# Patient Record
Sex: Male | Born: 1975 | Race: White | Hispanic: No | Smoking: Former smoker
Health system: Southern US, Community
[De-identification: ages and names within clinical notes are randomized; demographics above are authoritative.]

## PROBLEM LIST (undated history)

## (undated) DIAGNOSIS — J40 Bronchitis, not specified as acute or chronic: Secondary | ICD-10-CM

## (undated) DIAGNOSIS — J302 Other seasonal allergic rhinitis: Secondary | ICD-10-CM

## (undated) DIAGNOSIS — J45909 Unspecified asthma, uncomplicated: Secondary | ICD-10-CM

## (undated) DIAGNOSIS — F32A Depression, unspecified: Secondary | ICD-10-CM

## (undated) DIAGNOSIS — S8990XA Unspecified injury of unspecified lower leg, initial encounter: Secondary | ICD-10-CM

## (undated) HISTORY — PX: WISDOM TOOTH EXTRACTION: SHX21

## (undated) HISTORY — PX: KNEE ARTHROSCOPY: SHX127

## (undated) HISTORY — DX: Unspecified asthma, uncomplicated: J45.909

## (undated) HISTORY — DX: Bronchitis, not specified as acute or chronic: J40

## (undated) HISTORY — DX: Unspecified injury of unspecified lower leg, initial encounter: S89.90XA

## (undated) HISTORY — DX: Other seasonal allergic rhinitis: J30.2

## (undated) HISTORY — DX: Depression, unspecified: F32.A

---

## 1999-01-20 ENCOUNTER — Inpatient Hospital Stay (HOSPITAL_COMMUNITY): Admission: EM | Admit: 1999-01-20 | Discharge: 1999-01-21 | Payer: Self-pay | Admitting: Emergency Medicine

## 1999-01-20 ENCOUNTER — Encounter: Payer: Self-pay | Admitting: Surgery

## 1999-03-27 HISTORY — PX: KNEE ARTHROSCOPY: SHX127

## 2004-08-01 ENCOUNTER — Ambulatory Visit: Payer: Self-pay | Admitting: Family Medicine

## 2004-08-24 ENCOUNTER — Ambulatory Visit: Payer: Self-pay | Admitting: Family Medicine

## 2005-04-03 ENCOUNTER — Ambulatory Visit: Payer: Self-pay | Admitting: Family Medicine

## 2005-05-31 ENCOUNTER — Emergency Department (HOSPITAL_COMMUNITY): Admission: EM | Admit: 2005-05-31 | Discharge: 2005-05-31 | Payer: Self-pay | Admitting: Emergency Medicine

## 2006-02-19 ENCOUNTER — Ambulatory Visit: Payer: Self-pay | Admitting: Family Medicine

## 2006-05-31 ENCOUNTER — Ambulatory Visit: Payer: Self-pay | Admitting: Family Medicine

## 2013-09-02 ENCOUNTER — Ambulatory Visit (INDEPENDENT_AMBULATORY_CARE_PROVIDER_SITE_OTHER): Payer: No Typology Code available for payment source | Admitting: Family Medicine

## 2013-09-02 VITALS — BP 116/74 | HR 60 | Temp 98.6°F | Wt 214.2 lb

## 2013-09-02 DIAGNOSIS — L255 Unspecified contact dermatitis due to plants, except food: Secondary | ICD-10-CM

## 2013-09-02 MED ORDER — METHYLPREDNISOLONE (PAK) 4 MG PO TABS: ORAL_TABLET | ORAL | Status: DC

## 2013-09-02 MED ORDER — METHYLPREDNISOLONE ACETATE 80 MG/ML IJ SUSP
80.0000 mg | Freq: Once | INTRAMUSCULAR | Status: AC
  Administered 2013-09-02: 80 mg via INTRAMUSCULAR

## 2013-09-02 NOTE — Progress Notes (Signed)
   Subjective:    Patient ID: James Hull, male    DOB: Mar 04, 1976, 38 y.o.   MRN: [id number]  HPI This 38 y.o. male presents for evaluation of poison oak dermatitis and rash on face and bilateral arms right worse than left.   Review of Systems C/o rash and pruritis No chest pain, SOB, HA, dizziness, vision change, N/V, diarrhea, constipation, dysuria, urinary urgency or frequency, myalgias, arthralgias.     Objective:   Physical Exam Vital signs noted  Well developed well nourished male.  HEENT - Head atraumatic Normocephalic Respiratory - Lungs CTA bilateral Cardiac - RRR S1 and S2 without murmur Skin - Erythematous raised rash scattered across arms       Assessment & Plan:  Dermatitis due to plants, including poison ivy, sumac, and oak - Plan: methylPREDNIsolone (MEDROL DOSPACK) 4 MG tablet, methylPREDNISolone acetate (DEPO-MEDROL) injection 80 mg Take benadryl otc for pruritis.  Lysbeth Penner FNP

## 2014-02-09 ENCOUNTER — Encounter: Payer: Self-pay | Admitting: Family Medicine

## 2014-02-09 ENCOUNTER — Ambulatory Visit (INDEPENDENT_AMBULATORY_CARE_PROVIDER_SITE_OTHER): Payer: BC Managed Care – PPO | Admitting: Family Medicine

## 2014-02-09 VITALS — BP 107/71 | HR 83 | Temp 99.0°F | Ht 72.0 in | Wt 222.0 lb

## 2014-02-09 DIAGNOSIS — J029 Acute pharyngitis, unspecified: Secondary | ICD-10-CM

## 2014-02-09 LAB — POCT RAPID STREP A (OFFICE): Rapid Strep A Screen: NEGATIVE

## 2014-02-09 MED ORDER — AZITHROMYCIN 250 MG PO TABS: ORAL_TABLET | ORAL | Status: DC

## 2014-02-09 NOTE — Addendum Note (Signed)
Addended by: Lysbeth Penner on: 02/09/2014 03:34 PM   Modules accepted: Orders, SmartSet

## 2014-02-09 NOTE — Progress Notes (Signed)
   Subjective:    Patient ID: James Hull, male    DOB: 03/09/1976, 38 y.o.   MRN: [id number]  HPI Patient c/o sore throat and uri sx's.  Review of Systems  Constitutional: Negative for fever.  HENT: Negative for ear pain.   Eyes: Negative for discharge.  Respiratory: Negative for cough.   Cardiovascular: Negative for chest pain.  Gastrointestinal: Negative for abdominal distention.  Endocrine: Negative for polyuria.  Genitourinary: Negative for difficulty urinating.  Musculoskeletal: Negative for gait problem and neck pain.  Skin: Negative for color change and rash.  Neurological: Negative for speech difficulty and headaches.  Psychiatric/Behavioral: Negative for agitation.       Objective:    BP 107/71 mmHg  Pulse 83  Temp(Src) 99 F (37.2 C) (Oral)  Ht 6' (1.829 m)  Wt 222 lb (100.699 kg)  BMI 30.10 kg/m2 Physical Exam  Constitutional: He is oriented to person, place, and time. He appears well-developed and well-nourished.  HENT:  Head: Normocephalic and atraumatic.  Mouth/Throat: Oropharynx is clear and moist.  Eyes: Pupils are equal, round, and reactive to light.  Neck: Normal range of motion. Neck supple.  Cardiovascular: Normal rate and regular rhythm.   No murmur heard. Pulmonary/Chest: Effort normal and breath sounds normal.  Abdominal: Soft. Bowel sounds are normal. There is no tenderness.  Neurological: He is alert and oriented to person, place, and time.  Skin: Skin is warm and dry.  Psychiatric: He has a normal mood and affect.          Assessment & Plan:     ICD-9-CM ICD-10-CM   1. Sore throat 462 J02.9 POCT rapid strep A   Push po fluids, rest, tylenol and motrin otc prn as directed for fever, arthralgias, and myalgias.  Follow up prn if sx's continue or persist.  Return if symptoms worsen or fail to improve.  Lysbeth Penner FNP

## 2014-12-13 ENCOUNTER — Encounter: Payer: Self-pay | Admitting: Pediatrics

## 2014-12-13 ENCOUNTER — Ambulatory Visit (INDEPENDENT_AMBULATORY_CARE_PROVIDER_SITE_OTHER): Payer: 59 | Admitting: Pediatrics

## 2014-12-13 VITALS — BP 111/75 | HR 67 | Temp 97.2°F | Ht 72.0 in | Wt 218.4 lb

## 2014-12-13 DIAGNOSIS — K649 Unspecified hemorrhoids: Secondary | ICD-10-CM | POA: Insufficient documentation

## 2014-12-13 NOTE — Progress Notes (Signed)
    Subjective:    Patient ID: James Hull, male    DOB: December 04, 1975, 39 y.o.   MRN: [id number]  HPI: James Hull is a 39 y.o. male presenting on 12/13/2014 for Hemorrhoids  Taking probiotics for the past 3 weeks.   Two weeks ago starting having symptoms, rectum was in pain. Hurts after uses bathroom, hurts for hours afterwards. Has not seen any blood. Having bowel movements 1-2 times a day. Bowel movements now have been feeling fairly normal. Right before starting probiotics felt constipated though he had been reguarly stooling. Has felt full before eating. Normal appetite now. No early satiety. Trying to lose weight.   Powered para ggiding, with a para moter, sits in a sling while doing it, increases pain.   Gets better when exercising all the time, not exercising as much recently.   Relevant past medical, surgical, family and social history reviewed and updated as indicated. Interim medical history since our last visit reviewed. Allergies and medications reviewed and updated.   ROS: Per HPI unless specifically indicated above     Objective:    BP 111/75 mmHg  Pulse 67  Temp(Src) 97.2 F (36.2 C) (Oral)  Ht 6' (1.829 m)  Wt 218 lb 6.4 oz (99.066 kg)  BMI 29.61 kg/m2  Wt Readings from Last 3 Encounters:  12/13/14 218 lb 6.4 oz (99.066 kg)  02/09/14 222 lb (100.699 kg)  09/02/13 214 lb 3.2 oz (97.16 kg)     Gen: NAD, alert, cooperative with exam, NCAT EYES: EOMI, no scleral injection or icterus CV: NRRR, normal S1/S2, no murmur, DP pulses 2+ b/l Resp: CTABL, no wheezes, normal WOB Abd: +BS, soft, NTND. no guarding or organomegaly Rectal: Normal external exam, pain with digital rectal exam, no gross blood, no masses palpated. Neuro: Alert and oriented, strength equal b/l UE and LE, coordination grossly normal MSK: normal muscle bulk     Assessment & Plan:   [name] was seen today for hemorrhoids.  Diagnoses and all orders for this visit:  Hemorrhoids,  unspecified hemorrhoid type -- increase fiber in diet -- continue preparation H prn -- gave handout on symptomatic care   Follow up plan: Return if symptoms worsen or fail to improve.  Assunta Found, MD [facility] 12/13/2014, 8:37 PM

## 2014-12-13 NOTE — Patient Instructions (Addendum)
Fiber supplements:  Metamucil benefiber  Docusate--stool softener  Hemorrhoids Hemorrhoids are swollen veins around the rectum or anus. There are two types of hemorrhoids:   Internal hemorrhoids. These occur in the veins just inside the rectum. They may poke through to the outside and become irritated and painful.  External hemorrhoids. These occur in the veins outside the anus and can be felt as a painful swelling or hard lump near the anus. CAUSES  Pregnancy.   Obesity.   Constipation or diarrhea.   Straining to have a bowel movement.   Sitting for long periods on the toilet.  Heavy lifting or other activity that caused you to strain.  Anal intercourse. SYMPTOMS   Pain.   Anal itching or irritation.   Rectal bleeding.   Fecal leakage.   Anal swelling.   One or more lumps around the anus.  DIAGNOSIS  Your caregiver may be able to diagnose hemorrhoids by visual examination. Other examinations or tests that may be performed include:   Examination of the rectal area with a gloved hand (digital rectal exam).   Examination of anal canal using a small tube (scope).   A blood test if you have lost a significant amount of blood.  A test to look inside the colon (sigmoidoscopy or colonoscopy). TREATMENT Most hemorrhoids can be treated at home. However, if symptoms do not seem to be getting better or if you have a lot of rectal bleeding, your caregiver may perform a procedure to help make the hemorrhoids get smaller or remove them completely. Possible treatments include:   Placing a rubber band at the base of the hemorrhoid to cut off the circulation (rubber band ligation).   Injecting a chemical to shrink the hemorrhoid (sclerotherapy).   Using a tool to burn the hemorrhoid (infrared light therapy).   Surgically removing the hemorrhoid (hemorrhoidectomy).   Stapling the hemorrhoid to block blood flow to the tissue (hemorrhoid stapling).  HOME  CARE INSTRUCTIONS   Eat foods with fiber, such as whole grains, beans, nuts, fruits, and vegetables. Ask your doctor about taking products with added fiber in them (fibersupplements).  Increase fluid intake. Drink enough water and fluids to keep your urine clear or pale yellow.   Exercise regularly.   Go to the bathroom when you have the urge to have a bowel movement. Do not wait.   Avoid straining to have bowel movements.   Keep the anal area dry and clean. Use wet toilet paper or moist towelettes after a bowel movement.   Medicated creams and suppositories may be used or applied as directed.   Only take over-the-counter or prescription medicines as directed by your caregiver.   Take warm sitz baths for 15-20 minutes, 3-4 times a day to ease pain and discomfort.   Place ice packs on the hemorrhoids if they are tender and swollen. Using ice packs between sitz baths may be helpful.   Put ice in a plastic bag.   Place a towel between your skin and the bag.   Leave the ice on for 15-20 minutes, 3-4 times a day.   Do not use a donut-shaped pillow or sit on the toilet for long periods. This increases blood pooling and pain.  SEEK MEDICAL CARE IF:  You have increasing pain and swelling that is not controlled by treatment or medicine.  You have uncontrolled bleeding.  You have difficulty or you are unable to have a bowel movement.  You have pain or inflammation outside the area of  the hemorrhoids. MAKE SURE YOU:  Understand these instructions.  Will watch your condition.  Will get help right away if you are not doing well or get worse. Document Released: 03/09/2000 Document Revised: 02/27/2012 Document Reviewed: 01/15/2012 [facility] Patient Information 2015 [place], [place]. This information is not intended to replace advice given to you by your health care provider. Make sure you discuss any questions you have with your health care provider.

## 2015-03-24 ENCOUNTER — Encounter: Payer: Self-pay | Admitting: Family Medicine

## 2015-03-24 ENCOUNTER — Ambulatory Visit (INDEPENDENT_AMBULATORY_CARE_PROVIDER_SITE_OTHER): Payer: 59 | Admitting: Family Medicine

## 2015-03-24 VITALS — BP 121/80 | HR 78 | Temp 101.3°F | Ht 72.0 in | Wt 217.6 lb

## 2015-03-24 DIAGNOSIS — J209 Acute bronchitis, unspecified: Secondary | ICD-10-CM | POA: Diagnosis not present

## 2015-03-24 DIAGNOSIS — R52 Pain, unspecified: Secondary | ICD-10-CM

## 2015-03-24 LAB — POCT INFLUENZA A/B
Influenza A, POC: NEGATIVE
Influenza B, POC: NEGATIVE

## 2015-03-24 MED ORDER — ALBUTEROL SULFATE HFA 108 (90 BASE) MCG/ACT IN AERS: 2.0000 | INHALATION_SPRAY | Freq: Four times a day (QID) | RESPIRATORY_TRACT | Status: DC | PRN

## 2015-03-24 MED ORDER — AZITHROMYCIN 250 MG PO TABS: ORAL_TABLET | ORAL | Status: DC

## 2015-03-24 NOTE — Progress Notes (Signed)
BP 121/80 mmHg  Pulse 78  Temp(Src) 101.3 F (38.5 C) (Oral)  Ht 6' (1.829 m)  Wt 217 lb 9.6 oz (98.703 kg)  BMI 29.51 kg/m2   Subjective:    Patient ID: James Hull, male    DOB: 1975-10-12, 39 y.o.   MRN: [id number]  HPI: James Hull is a 39 y.o. male presenting on 03/24/2015 for Diarrhea; Cough; and Sinusitis   HPI Fever and sinus congestion and sore throat Patient has been having fever and sinus congestion and sore throat for the past 3 days. He developed a little diarrhea earlier this morning as well. He denies any fevers or chills that he is noticed. He also has a cough and yellowish sputum. He denies any shortness of breath or wheezing but does feel like he starting some chest congestion. He denies any sick contacts. He is tempted to use Sinus and cold and Alka-Seltzer without much success.  Relevant past medical, surgical, family and social history reviewed and updated as indicated. Interim medical history since our last visit reviewed. Allergies and medications reviewed and updated.  Review of Systems  Constitutional: Positive for fever. Negative for chills.  HENT: Positive for congestion, postnasal drip, rhinorrhea, sinus pressure and sore throat. Negative for ear discharge, ear pain, sneezing and voice change.   Eyes: Negative for pain, discharge, redness and visual disturbance.  Respiratory: Positive for cough. Negative for chest tightness, shortness of breath and wheezing.   Cardiovascular: Negative for chest pain and leg swelling.  Gastrointestinal: Negative for abdominal pain, diarrhea and constipation.  Genitourinary: Negative for difficulty urinating.  Musculoskeletal: Negative for back pain and gait problem.  Skin: Negative for rash.  Neurological: Negative for syncope, light-headedness and headaches.  All other systems reviewed and are negative.   Per HPI unless specifically indicated above     Medication List    Notice  As of 03/24/2015  12:00 PM   You have not been prescribed any medications.         Objective:    BP 121/80 mmHg  Pulse 78  Temp(Src) 101.3 F (38.5 C) (Oral)  Ht 6' (1.829 m)  Wt 217 lb 9.6 oz (98.703 kg)  BMI 29.51 kg/m2  Wt Readings from Last 3 Encounters:  03/24/15 217 lb 9.6 oz (98.703 kg)  12/13/14 218 lb 6.4 oz (99.066 kg)  02/09/14 222 lb (100.699 kg)    Physical Exam  Constitutional: He is oriented to person, place, and time. He appears well-developed and well-nourished. No distress.  HENT:  Right Ear: Tympanic membrane, external ear and ear canal normal.  Left Ear: Tympanic membrane, external ear and ear canal normal.  Nose: Mucosal edema and rhinorrhea present. No sinus tenderness. No epistaxis. Right sinus exhibits maxillary sinus tenderness. Right sinus exhibits no frontal sinus tenderness. Left sinus exhibits maxillary sinus tenderness. Left sinus exhibits no frontal sinus tenderness.  Mouth/Throat: Uvula is midline and mucous membranes are normal. Posterior oropharyngeal edema and posterior oropharyngeal erythema present. No oropharyngeal exudate or tonsillar abscesses.  Eyes: Conjunctivae and EOM are normal. Pupils are equal, round, and reactive to light. Right eye exhibits no discharge. No scleral icterus.  Neck: Neck supple. No thyromegaly present.  Cardiovascular: Normal rate, regular rhythm, normal heart sounds and intact distal pulses.   No murmur heard. Pulmonary/Chest: Effort normal and breath sounds normal. No respiratory distress. He has no wheezes. He has no rales.  Musculoskeletal: Normal range of motion. He exhibits no edema.  Lymphadenopathy:    He has  no cervical adenopathy.  Neurological: He is alert and oriented to person, place, and time. Coordination normal.  Skin: Skin is warm and dry. No rash noted. He is not diaphoretic.  Psychiatric: He has a normal mood and affect. His behavior is normal.  Vitals reviewed.   Results for orders placed or performed in visit  on 02/09/14  POCT rapid strep A  Result Value Ref Range   Rapid Strep A Screen Negative Negative      Assessment & Plan:   Problem List Items Addressed This Visit    None    Visit Diagnoses    Body aches    -  Primary    Relevant Orders    POCT Influenza A/B (Completed)    Acute bronchitis, unspecified organism        Relevant Medications    azithromycin (ZITHROMAX) 250 MG tablet    albuterol (PROVENTIL HFA;VENTOLIN HFA) 108 (90 Base) MCG/ACT inhaler        Follow up plan: Return if symptoms worsen or fail to improve.  Counseling provided for all of the vaccine components Orders Placed This Encounter  Procedures  . POCT Influenza A/B    Caryl Pina, MD [facility] 03/24/2015, 12:00 PM

## 2015-08-04 ENCOUNTER — Ambulatory Visit (INDEPENDENT_AMBULATORY_CARE_PROVIDER_SITE_OTHER): Payer: 59

## 2015-08-04 ENCOUNTER — Ambulatory Visit (INDEPENDENT_AMBULATORY_CARE_PROVIDER_SITE_OTHER): Payer: 59 | Admitting: Family Medicine

## 2015-08-04 ENCOUNTER — Encounter: Payer: Self-pay | Admitting: Family Medicine

## 2015-08-04 VITALS — BP 119/84 | HR 58 | Temp 97.1°F | Ht 73.83 in | Wt 211.4 lb

## 2015-08-04 DIAGNOSIS — M25511 Pain in right shoulder: Secondary | ICD-10-CM

## 2015-08-04 NOTE — Progress Notes (Signed)
   HPI  Patient presents today here with left shoulder pain.  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 He heard a pop at the time and then immediately placed his shoulder back.  He is suspicious that he had a shoulder dislocation.  He has pain with very specific movements currently, mainly forceful reaching across the chest of his left hand. He denies any limitations in range of motion or function currently. He has no pain at baseline, only with specific movements. No erythema, swelling, or tenderness to palpation.  PMH: Smoking status noted ROS: Per HPI  Objective: BP 119/84 mmHg  Pulse 58  Temp(Src) 97.1 F (36.2 C) (Oral)  Ht 6' 1.83" (1.875 m)  Wt 211 lb 6.4 oz (95.89 kg)  BMI 27.28 kg/m2 Gen: NAD, alert, cooperative with exam HEENT: NCAT CV: RRR, good S1/S2, no murmur Resp: CTABL, no wheezes, non-labored Ext: No edema, warm Neuro: Alert and oriented, No gross deficits   Shoulder Left shoulder with no tenderness to palpation of any of the bony landmarks Full range of motion, some tenderness with range of motion over his head Negative Hawkins and empty can test   Assessment and plan:  # Shoulder pain Likely shoulder dislocation and relocation Discussed conservative therapy, supportive care NSAIDs as needed, ice, and avoiding insult for a few weeks. Return to clinic if symptoms worsen, return, or he has other concerns.     Orders Placed This Encounter  Procedures  . DG Shoulder Left    Standing Status: Future     Number of Occurrences: 1     Standing Expiration Date: 10/03/2016    Order Specific Question:  Reason for Exam (SYMPTOM  OR DIAGNOSIS REQUIRED)    Answer:  shoulder pain    Order Specific Question:  Preferred imaging location?    Answer:  Internal     Laroy Apple, MD [facility] 08/04/2015, 8:53 AM

## 2015-09-05 ENCOUNTER — Telehealth: Payer: Self-pay | Admitting: Family Medicine

## 2015-09-05 ENCOUNTER — Encounter: Payer: Self-pay | Admitting: Gastroenterology

## 2015-09-05 DIAGNOSIS — K649 Unspecified hemorrhoids: Secondary | ICD-10-CM

## 2015-09-05 NOTE — Telephone Encounter (Signed)
Patient aware that referral has been made.  

## 2015-09-05 NOTE — Telephone Encounter (Signed)
Go ahead and do a referral to gastroenterology for persistent hemorrhoids

## 2015-09-07 ENCOUNTER — Telehealth: Payer: Self-pay | Admitting: Family Medicine

## 2015-09-07 NOTE — Telephone Encounter (Signed)
Will work on this

## 2015-09-30 ENCOUNTER — Telehealth: Payer: Self-pay | Admitting: Gastroenterology

## 2015-09-30 NOTE — Telephone Encounter (Signed)
Pt scheduled to see Alonza Bogus PA 10/03/15@2 :30pm. Left message for pt to call back and confirm the appt.

## 2015-10-03 ENCOUNTER — Encounter: Payer: Self-pay | Admitting: Gastroenterology

## 2015-10-03 ENCOUNTER — Ambulatory Visit (INDEPENDENT_AMBULATORY_CARE_PROVIDER_SITE_OTHER): Payer: 59 | Admitting: Gastroenterology

## 2015-10-03 VITALS — BP 122/74 | HR 80 | Ht 72.44 in | Wt 208.0 lb

## 2015-10-03 DIAGNOSIS — K6289 Other specified diseases of anus and rectum: Secondary | ICD-10-CM | POA: Diagnosis not present

## 2015-10-03 DIAGNOSIS — K602 Anal fissure, unspecified: Secondary | ICD-10-CM

## 2015-10-03 MED ORDER — DILTIAZEM GEL 2 %: 1.0000 "application " | Freq: Three times a day (TID) | CUTANEOUS | Status: DC

## 2015-10-03 NOTE — Progress Notes (Signed)
Reviewed and agree with initial management plan.  Augustine Brannick T. Carlynn Leduc, MD FACG 

## 2015-10-03 NOTE — Telephone Encounter (Signed)
I left another message for the patient that if he can't make the appt today he call and cancel.

## 2015-10-03 NOTE — Progress Notes (Signed)
     10/03/2015 James Hull [id number] [birth year]   HISTORY OF PRESENT ILLNESS:  A 40 year old male who is new to our office and has been referred here by his PCP, Dr. Warrick Parisian, for evaluation of rectal pain. Patient has been experiencing rectal pain on and off in intensity since at least September of last year. He saw his PCP at that time was treated for hemorrhoids with Metamucil and Preparation H. He says that he keeps thinking that this will heal since it starts to get better, but then it just seems to get worse again. Sometimes it is excruciatingly painful to the point where he can't sit. Also some very painful to have a bowel movement. He has been taking Metamucil daily and increase his fluid intake, which has made his bowel movements very regular. He denies any rectal bleeding or any other associated symptoms.   Past Medical History  Diagnosis Date  . Bronchitis    Past Surgical History  Procedure Laterality Date  . Wisdom tooth extraction    . Knee arthroscopy Right     knee cap     reports that he quit smoking about 19 years ago. His smoking use included Cigarettes. He started smoking about 21 years ago. He smoked 0.25 packs per day. He does not have any smokeless tobacco history on file. He reports that he drinks alcohol. He reports that he does not use illicit drugs. family history includes Diabetes in his paternal grandfather. There is no history of Colon cancer. No Known Allergies    Outpatient Encounter Prescriptions as of 10/03/2015  Medication Sig  . albuterol (PROVENTIL HFA;VENTOLIN HFA) 108 (90 Base) MCG/ACT inhaler Inhale 2 puffs into the lungs every 6 (six) hours as needed for wheezing or shortness of breath.  . psyllium (METAMUCIL) 58.6 % powder Take 1 packet by mouth 3 (three) times daily.  Marland Kitchen diltiazem 2 % GEL Apply 1 application topically 3 (three) times daily.   No facility-administered encounter medications on file as of 10/03/2015.     REVIEW OF  SYSTEMS  : All other systems reviewed and negative except where noted in the History of Present Illness.   PHYSICAL EXAM: BP 122/74 mmHg  Pulse 80  Ht 6' 0.44" (1.84 m)  Wt 208 lb (94.348 kg)  BMI 27.87 kg/m2 General: Well developed white male in no acute distress Head: Normocephalic and atraumatic Eyes:  Sclerae anicteric, conjunctiva pink. Ears: Normal auditory acuity Lungs: Clear throughout to auscultation Heart: Regular rate and rhythm Abdomen: Soft, non-distended.  Normal bowel sounds.  Non-tender. Rectal:  No external abnormalities identified.  DRE revealed extreme tenderness posteriorly. Musculoskeletal: Symmetrical with no gross deformities  Skin: No lesions on visible extremities Extremities: No edema  Neurological: Alert oriented x 4, grossly non-focal Psychological:  Alert and cooperative. Normal mood and affect  ASSESSMENT AND PLAN: -Rectal pain:  Suspect anal fissure by history and exam although was not definitely able to see one on exam today due to pain.  Will treat with diltiazem gel TID for the next 6-8 weeks.  Sitz baths, keep stools soft.  Call back in 6-8 weeks with update.    CC:  Dettinger, Fransisca Kaufmann, MD

## 2015-10-03 NOTE — Patient Instructions (Signed)
We phone in a prescription for Diltiazem gel 2 % to [facility], [address]   [place]'s # is [phone]. Call back with an update in 6-8 weeks.

## 2015-11-02 ENCOUNTER — Ambulatory Visit: Payer: Self-pay | Admitting: Gastroenterology

## 2015-12-12 ENCOUNTER — Telehealth: Payer: Self-pay | Admitting: Gastroenterology

## 2015-12-12 NOTE — Telephone Encounter (Signed)
Patient will try clotrimazole cream to the area.

## 2015-12-12 NOTE — Telephone Encounter (Signed)
Patient states he feels the initial problem of the fissure healed quickly. He missed his follow up appointment. He had been without issues until about a week ago when he started having a stinging and burning sensation surrounding the rectum. He says he feels like it itches too. He has tried soaks which relieve him only while he is in them. Declines to go to the ER, but understands that if he cannot wait or the discomfort becomes unbearable, he should go. Appointment scheduled for the first available.

## 2015-12-13 ENCOUNTER — Other Ambulatory Visit: Payer: Self-pay

## 2015-12-13 ENCOUNTER — Telehealth: Payer: Self-pay | Admitting: Gastroenterology

## 2015-12-13 MED ORDER — DILTIAZEM GEL 2 %: 1.0000 "application " | Freq: Three times a day (TID) | CUTANEOUS | 2 refills | Status: DC

## 2015-12-13 NOTE — Telephone Encounter (Signed)
Pt aware that the diltiazem gel was sent to [facility], they are the only compounding pharmacy in his area.  Pt will keep appt tomorrow with Janett Billow.

## 2015-12-13 NOTE — Telephone Encounter (Signed)
Pt has been scheduled to see jessica on 12/14/15

## 2015-12-13 NOTE — Telephone Encounter (Signed)
Pt would like diltiazem 2 % gel sent to [facility].  He has an appt tomorrow 12/14/15 for rectal pain, and itching.  He did try tinactine but it made the itching worse.  He states that the itching stops only for a couple of hours after showering.  Pt was advised to keep the area clean and dry, do not rub the area pat dry.  He will keep stools soft and will keep appt as scheduled.

## 2015-12-14 ENCOUNTER — Encounter: Payer: Self-pay | Admitting: Gastroenterology

## 2015-12-14 ENCOUNTER — Ambulatory Visit (INDEPENDENT_AMBULATORY_CARE_PROVIDER_SITE_OTHER): Payer: 59 | Admitting: Gastroenterology

## 2015-12-14 VITALS — BP 102/72 | HR 87 | Resp 98 | Ht 72.4 in | Wt 204.2 lb

## 2015-12-14 DIAGNOSIS — L290 Pruritus ani: Secondary | ICD-10-CM | POA: Insufficient documentation

## 2015-12-14 MED ORDER — NYSTATIN-TRIAMCINOLONE 100000-0.1 UNIT/GM-% EX CREA: TOPICAL_CREAM | Freq: Three times a day (TID) | CUTANEOUS | Status: DC

## 2015-12-14 MED ORDER — NYSTATIN-TRIAMCINOLONE 100000-0.1 UNIT/GM-% EX CREA: TOPICAL_CREAM | CUTANEOUS | 1 refills | Status: DC

## 2015-12-14 MED ORDER — DILTIAZEM GEL 2 %: 1.0000 "application " | Freq: Three times a day (TID) | CUTANEOUS | 2 refills | Status: DC

## 2015-12-14 NOTE — Progress Notes (Signed)
Reviewed and agree with initial management plan.  Istvan Behar T. Faige Seely, MD FACG 

## 2015-12-14 NOTE — Patient Instructions (Addendum)
We sent a prescription for nystatin-triamcinolone cream. Use 3 times daily perianally. Avoid aggressive perianal hygiene.  Use blow dryer on cool setting to the perianal area.   Call us Monday 12-19-2015 with an update. Ask for [place] or Ermie Glendenning, CMA.

## 2015-12-14 NOTE — Progress Notes (Signed)
     12/14/2015 HOBBS PAROLA [id number] 31-Mar-1975   History of Present Illness:    Pleasant 40 year old male who was seen by myself in July with complaints of rectal pain. At that time he was empirically treated for an anal fissure using diltiazem gel. He said that he had been using that and the symptoms completely went away. He says that he was feeling great for the past 6 weeks. Then last Sunday he had to go [facility] for work and was very hot and sweaty in the peri-anal area. He started to develop a lot of of burning pain and itching around his anus. He tells me that it is absolutely driving him crazy and he is losing sleep because of it. He says that throughout the night he has to get up and spray the area with hot water because that is the only thing that seems to give him relief. He is trying his hardest not to rub or scratch the area.  Current Medications, Allergies, Past Medical History, Past Surgical History, Family History and Social History were reviewed in [employer] record.   Physical Exam: BP 102/72 (BP Location: Left Arm, Patient Position: Sitting, Cuff Size: Large)   Pulse 87   Resp (!) 98   Ht 6' 0.4" (1.839 m)   Wt 204 lb 3.2 oz (92.6 kg)   BMI 27.39 kg/m  General: Well developed white male in no acute distress Head: Normocephalic and atraumatic Eyes:  Sclerae anicteric, conjunctiva pink  Ears: Normal auditory acuity Heart: Regular rate and rhythm Rectal:  Severe peri-anal irritation with skin breakdown.  ? Fungal component. Musculoskeletal: Symmetrical with no gross deformities  Extremities: No edema  Neurological: Alert oriented x 4, grossly non-focal Psychological:  Alert and cooperative. Normal mood and affect  Assessment and Recommendations: -Perianal rash x irritation with burning pain and itching:  Will treat with nystatin/steroid cream TID.  Avoid over-aggressive peri-anal hygiene.  Dry the area well with blow-dryer on cool/air  dry rather than towel dry.  Call back with update early next week.

## 2015-12-16 ENCOUNTER — Ambulatory Visit: Payer: 59 | Admitting: Gastroenterology

## 2015-12-28 ENCOUNTER — Ambulatory Visit: Payer: 59 | Admitting: Gastroenterology

## 2016-02-25 ENCOUNTER — Other Ambulatory Visit: Payer: Self-pay | Admitting: Gastroenterology

## 2016-02-25 NOTE — Telephone Encounter (Signed)
Patient called for a refill on nystatin cream. He has been using Nystatin ointment and triamcinolone ointment (cheaper than mycolog) and needs the nystatin refilled. Called rx  into [place]. I okay triamcinolone x 1 as well.

## 2016-03-06 ENCOUNTER — Telehealth: Payer: Self-pay | Admitting: Gastroenterology

## 2016-03-07 MED ORDER — TRIAMCINOLONE ACETONIDE 0.1 % EX CREA: TOPICAL_CREAM | CUTANEOUS | 0 refills | Status: DC

## 2016-03-07 MED ORDER — NYSTATIN 100000 UNIT/GM EX CREA: TOPICAL_CREAM | CUTANEOUS | 0 refills | Status: DC

## 2016-03-07 NOTE — Telephone Encounter (Signed)
Continue he will need a follow up appt.

## 2016-03-07 NOTE — Telephone Encounter (Signed)
Patient called back in stating that he is still having rectal pain and thinks that he may be having a reaction to the medication(does not know name).

## 2016-03-07 NOTE — Telephone Encounter (Signed)
Pt prescription has been sent as requested, he is aware if the symptoms

## 2016-03-14 ENCOUNTER — Telehealth: Payer: Self-pay | Admitting: Gastroenterology

## 2016-03-14 NOTE — Telephone Encounter (Signed)
Pt states he has plenty of refills on mediations and wanted to be sure he should continue to take until after he begins to feel better, I advised him to take for 2-3 weeks after the pain is resolved and to call back for refills as needed

## 2016-03-26 HISTORY — PX: COLONOSCOPY: SHX174

## 2016-03-27 ENCOUNTER — Other Ambulatory Visit: Payer: Self-pay | Admitting: Gastroenterology

## 2016-04-23 ENCOUNTER — Other Ambulatory Visit: Payer: Self-pay | Admitting: Gastroenterology

## 2016-05-01 ENCOUNTER — Other Ambulatory Visit: Payer: Self-pay | Admitting: Gastroenterology

## 2016-05-01 ENCOUNTER — Telehealth: Payer: Self-pay | Admitting: Gastroenterology

## 2016-05-01 MED ORDER — NYSTATIN 100000 UNIT/GM EX CREA
TOPICAL_CREAM | CUTANEOUS | 0 refills | Status: DC
Start: 2016-05-01 — End: 2016-09-06

## 2016-05-01 NOTE — Telephone Encounter (Signed)
Left message on machine to call back  

## 2016-05-01 NOTE — Telephone Encounter (Signed)
The pt was advised to keep appt with Janett Billow for 05/09/16.  Also, a refill was requested for nystatin cream.  I have sent 1 refill and pt states he will keep appt

## 2016-05-09 ENCOUNTER — Ambulatory Visit (INDEPENDENT_AMBULATORY_CARE_PROVIDER_SITE_OTHER): Payer: 59 | Admitting: Gastroenterology

## 2016-05-09 ENCOUNTER — Encounter: Payer: Self-pay | Admitting: Gastroenterology

## 2016-05-09 VITALS — BP 108/74 | HR 60 | Ht 76.4 in | Wt 213.4 lb

## 2016-05-09 DIAGNOSIS — K6289 Other specified diseases of anus and rectum: Secondary | ICD-10-CM | POA: Diagnosis not present

## 2016-05-09 DIAGNOSIS — L290 Pruritus ani: Secondary | ICD-10-CM

## 2016-05-09 MED ORDER — NA SULFATE-K SULFATE-MG SULF 17.5-3.13-1.6 GM/177ML PO SOLN: 1.0000 | Freq: Once | ORAL | 0 refills | Status: AC

## 2016-05-09 NOTE — Patient Instructions (Signed)
You have been scheduled for a colonoscopy. Please follow written instructions given to you at your visit today.  Please pick up your prep supplies at the pharmacy within the next 1-3 days. If you use inhalers (even only as needed), please bring them with you on the day of your procedure. Your physician has requested that you go to www.startemmi.com and enter the access code given to you at your visit today. This web site gives a general overview about your procedure. However, you should still follow specific instructions given to you by our office regarding your preparation for the procedure.  Discontinue Diltiazem gel and nystatin cream

## 2016-05-09 NOTE — Progress Notes (Signed)
05/09/2016 James Hull [id number] Oct 26, 1975   HISTORY OF PRESENT ILLNESS:  This is a 41 year old male, previously treated for a fissure when I saw him initially in 09/2015.Marland Kitchen When I saw him last in September he had perianal irritation for which I gave him a nystatin and steroid cream. He has continued to use all 3 of these regimens 3 times a day since his visit here in September. Continues to have intermittent symptoms despite these treatments.  He tells me that he currently is not having any symptoms. He tells me that he was having rectal pain and itching and irritation and 2 days before he left for vacation to [place] his symptoms completely resolved and then recurred again the exact day that he came back from vacation.  He is EXTREMELY anxious.  He denies any rectal bleeding with all of this.  No bowel complaints.  No abdominal pain.   Past Medical History:  Diagnosis Date  . Bronchitis    Past Surgical History:  Procedure Laterality Date  . KNEE ARTHROSCOPY Right    knee cap   . WISDOM TOOTH EXTRACTION      reports that he quit smoking about 20 years ago. His smoking use included Cigarettes. He started smoking about 22 years ago. He smoked 0.25 packs per day. He has quit using smokeless tobacco. He reports that he drinks alcohol. He reports that he does not use drugs. family history includes Diabetes in his paternal grandfather. No Known Allergies    Outpatient Encounter Prescriptions as of 05/09/2016  Medication Sig  . albuterol (PROVENTIL HFA;VENTOLIN HFA) 108 (90 Base) MCG/ACT inhaler Inhale 2 puffs into the lungs every 6 (six) hours as needed for wheezing or shortness of breath.  . diltiazem 2 % GEL Apply 1 application topically 3 (three) times daily.  Marland Kitchen nystatin cream (MYCOSTATIN) APPLY A GENEROUS AMOUNT 3 TIMES A DAY TO PERIANAL AREA.  Marland Kitchen nystatin-triamcinolone (MYCOLOG II) cream Apply generous amount 3 times daily to the perianal area.  . psyllium (METAMUCIL) 58.6 %  powder Take 1 packet by mouth 3 (three) times daily.  Marland Kitchen triamcinolone cream (KENALOG) 0.1 % APPLY GENEROUS AMOUNT 3 TIMES A DAY TO PERIANAL AREA.   Facility-Administered Encounter Medications as of 05/09/2016  Medication  . nystatin-triamcinolone (MYCOLOG II) cream     REVIEW OF SYSTEMS  : All other systems reviewed and negative except where noted in the History of Present Illness.   PHYSICAL EXAM: BP 108/74   Pulse 60   Ht 6' 4.4" (1.941 m)   Wt 213 lb 6 oz (96.8 kg)   BMI 25.70 kg/m  General: Well developed white male in no acute distress Head: Normocephalic and atraumatic Eyes:  Sclerae anicteric, conjunctiva pink. Ears: Normal auditory acuity Lungs: Clear throughout to auscultation Heart: Regular rate and rhythm Abdomen: Soft, non-distended.  Normal bowel sounds.  Non-tender. Rectal:  No external abnormalities identified except a small area on the right where there is a small bump, which was a couple of inches from the anus.  No abnormalities or pain noted on DRE. Musculoskeletal: Symmetrical with no gross deformities  Skin: No lesions on visible extremities Extremities: No edema  Neurological: Alert oriented x 4, grossly non-focal Psychological:  Alert and cooperative. Normal mood and affect  ASSESSMENT AND PLAN: -Ongoing anal complaints:  Previously treated for a fissure. When I saw him last in September he had perianal irritation for which I gave him a nystatin and steroid cream. He has continued  to use all 3 of these regimens 3 times a day since his visit here in September. Continues to have intermittent symptoms. According to exam today I see no need for him to continue the nystatin or diltiazem. He will discontinue those. Can continue triamcinolone for now. I'm going to schedule him for colonoscopy to further evaluate be sure there no other issues.  CC:  Dettinger, Fransisca Kaufmann, MD

## 2016-05-10 NOTE — Progress Notes (Signed)
Reviewed and agree with initial management plan.  Kirsta Probert T. Gelsey Amyx, MD FACG 

## 2016-05-22 ENCOUNTER — Other Ambulatory Visit: Payer: Self-pay | Admitting: Gastroenterology

## 2016-06-27 ENCOUNTER — Encounter: Payer: Self-pay | Admitting: Gastroenterology

## 2016-06-27 ENCOUNTER — Ambulatory Visit (AMBULATORY_SURGERY_CENTER): Payer: 59 | Admitting: Gastroenterology

## 2016-06-27 VITALS — BP 102/71 | HR 50 | Temp 98.0°F | Resp 20 | Ht 76.0 in | Wt 213.0 lb

## 2016-06-27 DIAGNOSIS — D129 Benign neoplasm of anus and anal canal: Secondary | ICD-10-CM | POA: Diagnosis not present

## 2016-06-27 DIAGNOSIS — K6289 Other specified diseases of anus and rectum: Secondary | ICD-10-CM

## 2016-06-27 DIAGNOSIS — D128 Benign neoplasm of rectum: Secondary | ICD-10-CM | POA: Diagnosis not present

## 2016-06-27 DIAGNOSIS — Z8719 Personal history of other diseases of the digestive system: Secondary | ICD-10-CM | POA: Diagnosis not present

## 2016-06-27 DIAGNOSIS — K621 Rectal polyp: Secondary | ICD-10-CM | POA: Diagnosis not present

## 2016-06-27 DIAGNOSIS — D122 Benign neoplasm of ascending colon: Secondary | ICD-10-CM | POA: Diagnosis not present

## 2016-06-27 MED ORDER — SODIUM CHLORIDE 0.9 % IV SOLN: 500.0000 mL | INTRAVENOUS | Status: DC

## 2016-06-27 NOTE — Progress Notes (Signed)
Report to PACU, RN, vss, BBS= Clear.  

## 2016-06-27 NOTE — Patient Instructions (Signed)
Handout given on polyps  YOU HAD AN ENDOSCOPIC PROCEDURE TODAY: Refer to the procedure report and other information in the discharge instructions given to you for any specific questions about what was found during the examination. If this information does not answer your questions, please call [place] office at [phone] to clarify.   YOU SHOULD EXPECT: Some feelings of bloating in the abdomen. Passage of more gas than usual. Walking can help get rid of the air that was put into your GI tract during the procedure and reduce the bloating. If you had a lower endoscopy (such as a colonoscopy or flexible sigmoidoscopy) you may notice spotting of blood in your stool or on the toilet paper. Some abdominal soreness may be present for a day or two, also.  DIET: Your first meal following the procedure should be a light meal and then it is ok to progress to your normal diet. A half-sandwich or bowl of soup is an example of a good first meal. Heavy or fried foods are harder to digest and may make you feel nauseous or bloated. Drink plenty of fluids but you should avoid alcoholic beverages for 24 hours. If you had a esophageal dilation, please see attached instructions for diet.    ACTIVITY: Your care partner should take you home directly after the procedure. You should plan to take it easy, moving slowly for the rest of the day. You can resume normal activity the day after the procedure however YOU SHOULD NOT DRIVE, use power tools, machinery or perform tasks that involve climbing or major physical exertion for 24 hours (because of the sedation medicines used during the test).   SYMPTOMS TO REPORT IMMEDIATELY: A gastroenterologist can be reached at any hour. Please call [phone]  for any of the following symptoms:  Following lower endoscopy (colonoscopy, flexible sigmoidoscopy) Excessive amounts of blood in the stool  Significant tenderness, worsening of abdominal pains  Swelling of the abdomen that is  new, acute  Fever of 100 or higher    FOLLOW UP:  If any biopsies were taken you will be contacted by phone or by letter within the next 1-3 weeks. Call [phone]  if you have not heard about the biopsies in 3 weeks.  Please also call with any specific questions about appointments or follow up tests.  

## 2016-06-27 NOTE — Progress Notes (Signed)
Notified Dr Fuller Plan and Estrellita Ludwig that pt drank approx .12 oz gingerale today @ 1245.

## 2016-06-27 NOTE — Op Note (Signed)
[facility] Patient Name: James Hull Procedure Date: 06/27/2016 1:54 PM MRN: [id number] Endoscopist: Ladene Artist , MD Age: 41 Referring MD:  Date of Birth: [birth year] Gender: Male Account #: [id number] Procedure:                Colonoscopy Indications:              History of anal fissure, Rectal pain Medicines:                Monitored Anesthesia Care Procedure:                Pre-Anesthesia Assessment:                           - Prior to the procedure, a History and Physical                            was performed, and patient medications and                            allergies were reviewed. The patient's tolerance of                            previous anesthesia was also reviewed. The risks                            and benefits of the procedure and the sedation                            options and risks were discussed with the patient.                            All questions were answered, and informed consent                            was obtained. Prior Anticoagulants: The patient has                            taken no previous anticoagulant or antiplatelet                            agents. ASA Grade Assessment: II - A patient with                            mild systemic disease. After reviewing the risks                            and benefits, the patient was deemed in                            satisfactory condition to undergo the procedure.                           After obtaining informed consent, the colonoscope  was passed under direct vision. Throughout the                            procedure, the patient's blood pressure, pulse, and                            oxygen saturations were monitored continuously. The                            Colonoscope was introduced through the anus and                            advanced to the the terminal ileum. The terminal                            ileum, ileocecal valve,  appendiceal orifice, and                            rectum were photographed. The quality of the bowel                            preparation was excellent. The colonoscopy was                            performed without difficulty. The patient tolerated                            the procedure well. Scope In: 3:20:03 PM Scope Out: 3:35:42 PM Scope Withdrawal Time: 0 hours 12 minutes 37 seconds  Total Procedure Duration: 0 hours 15 minutes 39 seconds  Findings:                 The perianal exam findings include a posterior anal                            fissure.                           Two sessile polyps were found in the rectum and                            ascending colon. The polyps were 5 to 6 mm in size.                            These polyps were removed with a cold snare.                            Resection and retrieval were complete.                           The exam was otherwise without abnormality on                            direct and retroflexion views.  The terminal ileum appeared normal. Complications:            No immediate complications. Estimated blood loss:                            None. Estimated Blood Loss:     Estimated blood loss: none. Impression:               - Posterior anal fissure found on perianal exam.                           - Two 5 to 6 mm polyps in the rectum and in the                            ascending colon, removed with a cold snare.                            Resected and retrieved.                           - The examination was otherwise normal on direct                            and retroflexion views. Recommendation:           - Repeat colonoscopy in 5 years for surveillance if                            polyp(s) are precancerous, otherwise 10 years.                           - Patient has a contact number available for                            emergencies. The signs and symptoms of potential                             delayed complications were discussed with the                            patient. Return to normal activities tomorrow.                            Written discharge instructions were provided to the                            patient.                           - Resume previous diet.                           - Continue present medications.                           - Diltiazem 2% gel PR tid for 8 weeks.                           -  Await pathology results.                           - Consult [name], MD ([facility]) Ladene Artist, MD 06/27/2016 3:42:47 PM This report has been signed electronically.

## 2016-06-27 NOTE — Progress Notes (Signed)
Called to room to assist during endoscopic procedure.  Patient ID and intended procedure confirmed with present staff. Received instructions for my participation in the procedure from the performing physician.  

## 2016-06-28 ENCOUNTER — Telehealth: Payer: Self-pay | Admitting: *Deleted

## 2016-06-28 NOTE — Telephone Encounter (Signed)
  Follow up Call-  Call back number 06/27/2016  Post procedure Call Back phone  # [phone]  Permission to leave phone message Yes  Some recent data might be hidden     Patient questions:  Do you have a fever, pain , or abdominal swelling? No. Pain Score  0 *  Have you tolerated food without any problems? Yes.    Have you been able to return to your normal activities? Yes.    Do you have any questions about your discharge instructions: Diet   No. Medications  No. Follow up visit  No.  Do you have questions or concerns about your Care? No.  Actions: * If pain score is 4 or above: No action needed, pain <4.

## 2016-06-28 NOTE — Telephone Encounter (Signed)
Message left

## 2016-07-02 ENCOUNTER — Telehealth: Payer: Self-pay

## 2016-07-02 NOTE — Telephone Encounter (Signed)
Patient has been scheduled for a surgical consult for 08/06/16 1:30 for anal fissure. Appt was scheduled by [facility] and they have notified him.

## 2016-07-12 ENCOUNTER — Encounter: Payer: Self-pay | Admitting: Gastroenterology

## 2016-07-24 ENCOUNTER — Ambulatory Visit: Payer: Self-pay | Admitting: Surgery

## 2016-07-24 DIAGNOSIS — K602 Anal fissure, unspecified: Secondary | ICD-10-CM | POA: Diagnosis not present

## 2016-07-24 DIAGNOSIS — L290 Pruritus ani: Secondary | ICD-10-CM | POA: Diagnosis not present

## 2016-07-24 NOTE — H&P (Signed)
THAXTON PELLEY 07/24/2016 9:41 AM Location: [facility] Patient #: [id number] DOB: April 10, 1975 Married / Language: English / Race: Black or African American Male  History of Present Illness Adin Hector MD; 07/24/2016 10:18 AM) The patient is a 41 year old male who presents with an anal fissure. Note for "Anal fissure": ` ` ` Patient sent for surgical consultation at the request of Dr. Lucio Edward  Chief Complaint: Anal pain with fissure  The patient is a 41 year old male whose been struggling with anal pain and itching since late 2016. Was sent to gastroenterology last July. Concern for possible anal fissure. Started on diltiazem cream. Then felt more as an itching rash issue. Switch to antifungal in steroid cream. Persisted. Treated for pruritus and more aggressive cream regimen in September 2017. Seemed to improve but would recur. Very anxious and concerned. Seen back February 2018. Underwent colonoscopy in April 2018. Tubular adenoma removed. Anal fissure noted. No concern for inflammatory bowel disease or other problems. Scheduled for consultation for anal fissure May 14. Apparently patient having worsening symptoms and wished to be moved up sooner. Moved up to today, May 1.  (Review of systems as stated in this history (HPI) or in the review of systems. Otherwise all other 12 point ROS are negative)   Past Surgical History Malachy Moan, RMA; 07/24/2016 9:41 AM) No pertinent past surgical history  Diagnostic Studies History Malachy Moan, RMA; 07/24/2016 9:41 AM) Colonoscopy within last year  Allergies Malachy Moan, RMA; 07/24/2016 9:44 AM) No Known Allergies 07/24/2016  Medication History Malachy Moan, RMA; 07/24/2016 9:43 AM) Triamcinolone Acetonide (0.1% Cream, External) Active. Suprep Bowel Prep Kit (17.5-3.13-1.6GM/180ML Solution, Oral) Active. Nystatin (100000 UNIT/GM Cream, External) Active. Medications  Reconciled  Social History Malachy Moan, [place]; 07/24/2016 9:41 AM) Alcohol use Occasional alcohol use. Caffeine use Carbonated beverages, Coffee. Illicit drug use Uses socially only. Tobacco use Former smoker.  Family History Malachy Moan, [place]; 07/24/2016 9:41 AM) Family history unknown First Degree Relatives  Other Problems Malachy Moan, [place]; 07/24/2016 9:41 AM) Asthma     Review of Systems Malachy Moan RMA; 07/24/2016 9:41 AM) General Not Present- Appetite Loss, Chills, Fatigue, Fever, Night Sweats, Weight Gain and Weight Loss. Skin Not Present- Change in Wart/Mole, Dryness, Hives, Jaundice, New Lesions, Non-Healing Wounds, Rash and Ulcer. HEENT Not Present- Earache, Hearing Loss, Hoarseness, Nose Bleed, Oral Ulcers, Ringing in the Ears, Seasonal Allergies, Sinus Pain, Sore Throat, Visual Disturbances, Wears glasses/contact lenses and Yellow Eyes. Respiratory Not Present- Bloody sputum, Chronic Cough, Difficulty Breathing, Snoring and Wheezing. Breast Not Present- Breast Mass, Breast Pain, Nipple Discharge and Skin Changes. Cardiovascular Not Present- Chest Pain, Difficulty Breathing Lying Down, Leg Cramps, Palpitations, Rapid Heart Rate, Shortness of Breath and Swelling of Extremities. Gastrointestinal Present- Rectal Pain. Not Present- Abdominal Pain, Bloating, Bloody Stool, Change in Bowel Habits, Chronic diarrhea, Constipation, Difficulty Swallowing, Excessive gas, Gets full quickly at meals, Hemorrhoids, Indigestion, Nausea and Vomiting. Male Genitourinary Not Present- Blood in Urine, Change in Urinary Stream, Frequency, Impotence, Nocturia, Painful Urination, Urgency and Urine Leakage. Musculoskeletal Not Present- Back Pain, Joint Pain, Joint Stiffness, Muscle Pain, Muscle Weakness and Swelling of Extremities. Neurological Not Present- Decreased Memory, Fainting, Headaches, Numbness, Seizures, Tingling, Tremor, Trouble walking and Weakness. Psychiatric Present-  Change in Sleep Pattern and Frequent crying. Not Present- Anxiety, Bipolar, Depression and Fearful. Endocrine Not Present- Cold Intolerance, Excessive Hunger, Hair Changes, Heat Intolerance, Hot flashes and New Diabetes. Hematology Not Present- Blood Thinners, Easy Bruising, Excessive bleeding, Gland problems, HIV and Persistent Infections.  Vitals Malachy Moan RMA; 07/24/2016 9:44 AM) 07/24/2016 9:44 AM Weight: 217.2 lb Height: 73in Body Surface Area: 2.23 m Body Mass Index: 28.66 kg/m  Temp.: 98.41F  Pulse: 71 (Regular)  BP: 130/80 (Sitting, Left Arm, Standard)      Physical Exam Adin Hector MD; 07/24/2016 10:25 AM)  General Mental Status-Alert. General Appearance-Not in acute distress, Not Sickly. Orientation-Oriented X3. Hydration-Well hydrated. Voice-Normal.  Integumentary Global Assessment Upon inspection and palpation of skin surfaces of the - Axillae: non-tender, no inflammation or ulceration, no drainage. and Distribution of scalp and body hair is normal. General Characteristics Temperature - normal warmth is noted.  Head and Neck Head-normocephalic, atraumatic with no lesions or palpable masses. Face Global Assessment - atraumatic, no absence of expression. Neck Global Assessment - no abnormal movements, no bruit auscultated on the right, no bruit auscultated on the left, no decreased range of motion, non-tender. Trachea-midline. Thyroid Gland Characteristics - non-tender.  Eye Eyeball - Left-Extraocular movements intact, No Nystagmus. Eyeball - Right-Extraocular movements intact, No Nystagmus. Cornea - Left-No Hazy. Cornea - Right-No Hazy. Sclera/Conjunctiva - Left-No scleral icterus, No Discharge. Sclera/Conjunctiva - Right-No scleral icterus, No Discharge. Pupil - Left-Direct reaction to light normal. Pupil - Right-Direct reaction to light normal.  ENMT Ears Pinna - Left - no drainage observed, no  generalized tenderness observed. Right - no drainage observed, no generalized tenderness observed. Nose and Sinuses External Inspection of the Nose - no destructive lesion observed. Inspection of the nares - Left - quiet respiration. Right - quiet respiration. Mouth and Throat Lips - Upper Lip - no fissures observed, no pallor noted. Lower Lip - no fissures observed, no pallor noted. Nasopharynx - no discharge present. Oral Cavity/Oropharynx - Tongue - no dryness observed. Oral Mucosa - no cyanosis observed. Hypopharynx - no evidence of airway distress observed.  Chest and Lung Exam Inspection Movements - Normal and Symmetrical. Accessory muscles - No use of accessory muscles in breathing. Palpation Palpation of the chest reveals - Non-tender. Auscultation Breath sounds - Normal and Clear.  Cardiovascular Auscultation Rhythm - Regular. Murmurs & Other Heart Sounds - Auscultation of the heart reveals - No Murmurs and No Systolic Clicks.  Abdomen Inspection Inspection of the abdomen reveals - No Visible peristalsis and No Abnormal pulsations. Umbilicus - No Bleeding, No Urine drainage. Palpation/Percussion Palpation and Percussion of the abdomen reveal - Soft, Non Tender, No Rebound tenderness, No Rigidity (guarding) and No Cutaneous hyperesthesia. Note: Abdomen soft. Nontender, nondistended. No guarding. No diastasis. No umbilical nor other hernias  Male Genitourinary Sexual Maturity Tanner 5 - Adult hair pattern and Adult penile size and shape. Note: No inguinal hernias. Normal external genitalia. Epididymi, testes, and spermatic cords normal without any masses.  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 Increased sphincter tone. Scalloping of perianal tissue suspicious for some hemorrhoids as well.  Perianal skin clean with good hygiene. Mild pruritis ani. No pilonidal disease. No abscess/fistula. No condyloma warts. Held off on any digital or anoscopic  exam given the fissure and pain  Peripheral Vascular Upper Extremity Inspection - Left - No Cyanotic nailbeds, Not Ischemic. Right - No Cyanotic nailbeds, Not Ischemic.  Neurologic Neurologic evaluation reveals -normal attention span and ability to concentrate, able to name objects and repeat phrases. Appropriate fund of knowledge , normal sensation and normal coordination. Mental Status Affect - not angry, not paranoid. Cranial Nerves-Normal Bilaterally. Gait-Normal.  Neuropsychiatric Mental status exam performed with findings of-able to articulate well with normal speech/language, rate, volume and  coherence, thought content normal with ability to perform basic computations and apply abstract reasoning and no evidence of hallucinations, delusions, obsessions or homicidal/suicidal ideation. Note: Tearful and anxious. Crying numerous times. Ultimately consolable.  Musculoskeletal Global Assessment Spine, Ribs and Pelvis - no instability, subluxation or laxity. Right Upper Extremity - no instability, subluxation or laxity.  Lymphatic Head & Neck  General Head & Neck Lymphatics: Bilateral - Description - No Localized lymphadenopathy. Axillary  General Axillary Region: Bilateral - Description - No Localized lymphadenopathy. Femoral & Inguinal  Generalized Femoral & Inguinal Lymphatics: Left - Description - No Localized lymphadenopathy. Right - Description - No Localized lymphadenopathy.    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 Refractory to nonmedical management.  I feel that he would benefit from examination under anesthesia with probable partial fistulotomy. Suspect he has some worsening prolapsing hemorrhoids that are giving him pruritus as well. He is obviously quite miserable. He is interested in proceeding with surgery.  Discussed with at length. I spent 45 minutes  reviewing and discussing examining with him. Tearful and emotional and at wits end. He feels reassured and is trying to be optimistic that this will get better.  Current Plans Pt Education - [facility] Anal Fissure (Emmalise Huard) The anatomy & physiology of the anorectal region was discussed. The pathophysiology of anal fissure and differential diagnosis was discussed. Natural history progression was discussed. I stressed the importance of a bowel regimen to have daily soft bowel movements to minimize progression of disease.  The patient's condition is not adequately controlled. Non-operative treatment has not healed the fissure. Therefore, I recommended examination under anesthesia for better examination to confirm the diagnosis and treat by lateral internal sphincterotomy to relax the spasm better & allow the fissure to heal. Technique, benefits, alternatives were discussed. I noted a good likelihood this will help address the problem. Risks such as bleeding, pain, incontinence, recurrence, heart attack, death, and other risks were discussed.  Educational handouts further explaining the pathology, treatment options, and bowel regimen were given as well. The patient expressed understanding & wishes to proceed with surgery.  You are being scheduled for surgery- Our schedulers will call you.  You should hear from our office's scheduling department within 5 working days about the location, date, and time of surgery. We try to make accommodations for patient's preferences in scheduling surgery, but sometimes the OR schedule or the surgeon's schedule prevents [place] from making those accommodations.  If you have not heard from our office [phone]) in 5 working days, call the office and ask for your surgeon's nurse.  If you have other questions about your diagnosis, plan, or surgery, call the office and ask for your surgeon's nurse.  Pt Education - [facility] Rectal Prep for Anorectal outpatient/office  surgery: discussed with patient and provided information. Pt Education - [facility] Good Bowel Health (Ammar Moffatt) Pt Education - [facility] Rectal Surgery HCI (Annette Bertelson): discussed with patient and provided information. PRURITUS ANI (L29.0)  Current Plans Pt Education - [facility] Pruritus Ani (AT) Pt Education - [facility] Good Bowel Health (Ray Gervasi)  Adin Hector, M.D., F.A.C.S. Gastrointestinal and Minimally Invasive Surgery [facility] [address], [place], [place] [address] [phone] Main / Paging

## 2016-09-06 ENCOUNTER — Encounter: Payer: Self-pay | Admitting: Pediatrics

## 2016-09-06 ENCOUNTER — Ambulatory Visit (INDEPENDENT_AMBULATORY_CARE_PROVIDER_SITE_OTHER): Payer: 59 | Admitting: Pediatrics

## 2016-09-06 VITALS — BP 111/70 | HR 53 | Temp 96.8°F | Ht 76.0 in | Wt 213.2 lb

## 2016-09-06 DIAGNOSIS — J4520 Mild intermittent asthma, uncomplicated: Secondary | ICD-10-CM

## 2016-09-06 DIAGNOSIS — M62830 Muscle spasm of back: Secondary | ICD-10-CM | POA: Diagnosis not present

## 2016-09-06 MED ORDER — ALBUTEROL SULFATE HFA 108 (90 BASE) MCG/ACT IN AERS: 2.0000 | INHALATION_SPRAY | Freq: Four times a day (QID) | RESPIRATORY_TRACT | 2 refills | Status: DC | PRN

## 2016-09-06 MED ORDER — CYCLOBENZAPRINE HCL 10 MG PO TABS: 10.0000 mg | ORAL_TABLET | Freq: Three times a day (TID) | ORAL | 1 refills | Status: DC | PRN

## 2016-09-06 NOTE — Progress Notes (Signed)
  Subjective:   Patient ID: AIKEEM LILLEY, male    DOB: [birth year], 41 y.o.   MRN: [id number] CC: Back Pain (lower, 2 weeks, digging a hole)  HPI: GERAD CORNELIO is a 41 y.o. male presenting for Back Pain (lower, 2 weeks, digging a hole)  Felt some twinges in his back on both sides while digging  Was fine the next day, then went flying with powered paraglider thinks he strained back some then as well Dug hole again yesterday Has been doing tai chi some, thinks it helps Having spasms of pain b/l lower back muscles No fevers, no pain shooting down legs, no trouble emptying bladder No midline pain Hurts in lower back when he stretches back, such as when flexing forward from sitting position  Breathing has been well controlled, when he is in house with the cats a lot he feels more SOB and has to use inhaler sometimes Inhaler helps Not taking daily antihistamine, doesn't want to When outside able to do regular exercise, no trouble with breathing usually  Relevant past medical, surgical, family and social history reviewed. Allergies and medications reviewed and updated. History  Smoking Status  . Former Smoker  . Packs/day: 0.25  . Types: Cigarettes  . Start date: 02/09/1994  . Quit date: 02/10/1996  Smokeless Tobacco  . Former Systems developer   ROS: Per HPI   Objective:    BP 111/70   Pulse (!) 53   Temp (!) 96.8 F (36 C) (Oral)   Ht '6\' 4"'$  (1.93 m)   Wt 213 lb 3.2 oz (96.7 kg)   BMI 25.95 kg/m   Wt Readings from Last 3 Encounters:  09/06/16 213 lb 3.2 oz (96.7 kg)  06/27/16 213 lb (96.6 kg)  05/09/16 213 lb 6 oz (96.8 kg)    Gen: NAD, alert, cooperative with exam, NCAT EYES: EOMI, no conjunctival injection, or no icterus CV: NRRR, normal S1/S2, no murmur, distal pulses 2+ b/l Resp: CTABL, no wheezes, normal WOB Ext: No edema, warm Neuro: Alert and oriented, strength equal b/l UE and LE, coordination grossly normal, patellar reflex 1+ b/l, sensation intact b/l MSK: ttp  over b/l paraspinal muscles, no ttp over spine  Assessment & Plan:  Avin was seen today for back pain.  Diagnoses and all orders for this visit:  Spasm of muscle of lower back Ibuprofen '600mg'$  2-3 times a day Below as needed Gentle back exercises/stretches Avoid activities that make pain worse -     cyclobenzaprine (FLEXERIL) 10 MG tablet; Take 1 tablet (10 mg total) by mouth 3 (three) times daily as needed for muscle spasms.  Mild intermittent asthma without complication -     albuterol (PROVENTIL HFA;VENTOLIN HFA) 108 (90 Base) MCG/ACT inhaler; Inhale 2 puffs into the lungs every 6 (six) hours as needed for wheezing or shortness of breath.   Follow up plan: As needed Assunta Found, MD [place]

## 2016-09-06 NOTE — Patient Instructions (Addendum)
Back Exercises If you have pain in your back, do these exercises 2-3 times each day or as told by your doctor. When the pain goes away, do the exercises once each day, but repeat the steps more times for each exercise (do more repetitions). If you do not have pain in your back, do these exercises once each day or as told by your doctor. Exercises Single Knee to Chest  Do these steps 3-5 times in a row for each leg: 1. Lie on your back on a firm bed or the floor with your legs stretched out. 2. Bring one knee to your chest. 3. Hold your knee to your chest by grabbing your knee or thigh. 4. Pull on your knee until you feel a gentle stretch in your lower back. 5. Keep doing the stretch for 10-30 seconds. 6. Slowly let go of your leg and straighten it.  Pelvic Tilt  Do these steps 5-10 times in a row: 1. Lie on your back on a firm bed or the floor with your legs stretched out. 2. Bend your knees so they point up to the ceiling. Your feet should be flat on the floor. 3. Tighten your lower belly (abdomen) muscles to press your lower back against the floor. This will make your tailbone point up to the ceiling instead of pointing down to your feet or the floor. 4. Stay in this position for 5-10 seconds while you gently tighten your muscles and breathe evenly.  Cat-Cow  Do these steps until your lower back bends more easily: 1. Get on your hands and knees on a firm surface. Keep your hands under your shoulders, and keep your knees under your hips. You may put padding under your knees. 2. Let your head hang down, and make your tailbone point down to the floor so your lower back is round like the back of a cat. 3. Stay in this position for 5 seconds. 4. Slowly lift your head and make your tailbone point up to the ceiling so your back hangs low (sags) like the back of a cow. 5. Stay in this position for 5 seconds.  Press-Ups  Do these steps 5-10 times in a row: 1. Lie on your belly (face-down)  on the floor. 2. Place your hands near your head, about shoulder-width apart. 3. While you keep your back relaxed and keep your hips on the floor, slowly straighten your arms to raise the top half of your body and lift your shoulders. Do not use your back muscles. To make yourself more comfortable, you may change where you place your hands. 4. Stay in this position for 5 seconds. 5. Slowly return to lying flat on the floor.  Bridges  Do these steps 10 times in a row: 1. Lie on your back on a firm surface. 2. Bend your knees so they point up to the ceiling. Your feet should be flat on the floor. 3. Tighten your butt muscles and lift your butt off of the floor until your waist is almost as high as your knees. If you do not feel the muscles working in your butt and the back of your thighs, slide your feet 1-2 inches farther away from your butt. 4. Stay in this position for 3-5 seconds. 5. Slowly lower your butt to the floor, and let your butt muscles relax.  If this exercise is too easy, try doing it with your arms crossed over your chest. Back Lifts Do these steps 5-10 times in a   row: 1. Lie on your belly (face-down) with your arms at your sides, and rest your forehead on the floor. 2. Tighten the muscles in your legs and your butt. 3. Slowly lift your chest off of the floor while you keep your hips on the floor. Keep the back of your head in line with the curve in your back. Look at the floor while you do this. 4. Stay in this position for 3-5 seconds. 5. Slowly lower your chest and your face to the floor.  Contact a doctor if:  Your back pain gets a lot worse when you do an exercise.  Your back pain does not lessen 2 hours after you exercise. If you have any of these problems, stop doing the exercises. Do not do them again unless your doctor says it is okay. Get help right away if:  You have sudden, very bad back pain. If this happens, stop doing the exercises. Do not do them again  unless your doctor says it is okay. This information is not intended to replace advice given to you by your health care provider. Make sure you discuss any questions you have with your health care provider. Document Released: 04/14/2010 Document Revised: 08/18/2015 Document Reviewed: 05/06/2014 Elsevier Interactive Patient Education  2018 [employer]   

## 2017-01-20 ENCOUNTER — Emergency Department (HOSPITAL_COMMUNITY)
Admission: EM | Admit: 2017-01-20 | Discharge: 2017-01-20 | Disposition: A | Discharge status: Home / Self Care | Payer: 59 | Attending: Emergency Medicine | Admitting: Emergency Medicine

## 2017-01-20 ENCOUNTER — Encounter (HOSPITAL_COMMUNITY): Payer: Self-pay | Admitting: Emergency Medicine

## 2017-01-20 ENCOUNTER — Emergency Department (HOSPITAL_COMMUNITY): Payer: 59

## 2017-01-20 DIAGNOSIS — S99912A Unspecified injury of left ankle, initial encounter: Secondary | ICD-10-CM | POA: Diagnosis not present

## 2017-01-20 DIAGNOSIS — W1849XA Other slipping, tripping and stumbling without falling, initial encounter: Secondary | ICD-10-CM | POA: Insufficient documentation

## 2017-01-20 DIAGNOSIS — Z79899 Other long term (current) drug therapy: Secondary | ICD-10-CM | POA: Diagnosis not present

## 2017-01-20 DIAGNOSIS — M7989 Other specified soft tissue disorders: Secondary | ICD-10-CM | POA: Diagnosis not present

## 2017-01-20 DIAGNOSIS — Y929 Unspecified place or not applicable: Secondary | ICD-10-CM | POA: Diagnosis not present

## 2017-01-20 DIAGNOSIS — Y9301 Activity, walking, marching and hiking: Secondary | ICD-10-CM | POA: Diagnosis not present

## 2017-01-20 DIAGNOSIS — Z87891 Personal history of nicotine dependence: Secondary | ICD-10-CM | POA: Diagnosis not present

## 2017-01-20 DIAGNOSIS — S93402A Sprain of unspecified ligament of left ankle, initial encounter: Secondary | ICD-10-CM | POA: Diagnosis not present

## 2017-01-20 DIAGNOSIS — M25572 Pain in left ankle and joints of left foot: Secondary | ICD-10-CM | POA: Diagnosis not present

## 2017-01-20 DIAGNOSIS — Y998 Other external cause status: Secondary | ICD-10-CM | POA: Diagnosis not present

## 2017-01-20 MED ORDER — NAPROXEN 500 MG PO TABS: 500.0000 mg | ORAL_TABLET | Freq: Two times a day (BID) | ORAL | 0 refills | Status: DC

## 2017-01-20 MED ORDER — NAPROXEN 250 MG PO TABS
500.0000 mg | ORAL_TABLET | Freq: Once | ORAL | Status: AC
  Administered 2017-01-20: 500 mg via ORAL
  Filled 2017-01-20 (×2): qty 2

## 2017-01-20 NOTE — ED Notes (Signed)
Patient unable to bare weight on left leg. Can hop on right leg to bed.

## 2017-01-20 NOTE — Discharge Instructions (Signed)
Please read attached information regarding your condition, rice therapy and ankle range of motion exercises. Take naproxen as needed for pain. Wear ankle brace as directed and use crutches. Bear weight as tolerated. Follow-up with orthopedist listed below for further evaluation. Return to ED for worsening ankle pain, additional injuries or falls, red hot or swollen joints, numbness and leg.

## 2017-01-20 NOTE — ED Notes (Signed)
Declined W/C at D/C and was escorted to lobby by RN. 

## 2017-01-20 NOTE — ED Provider Notes (Signed)
[facility] EMERGENCY DEPARTMENT Provider Note   CSN: [id number] Arrival date & time: 01/20/17  1404     History   Chief Complaint Chief Complaint  Patient presents with  . Fall  . Ankle Injury    HPI James Hull is a 41 y.o. male we'll presents to ED for evaluation of left ankle injury that occurred just prior to arrival. He was hiking up in now and when he accidentally slipped and twisted his left ankle. He has been ambulatory with pain since the incident and had to walk back to his car. He reports much swelling in the area. He denies any previous fracture, dislocation or procedure in the area. He denies any head injury, loss of consciousness, numbness in legs, red hot or tender joint, fever.  HPI  Past Medical History:  Diagnosis Date  . Bronchitis   . Knee injury     Patient Active Problem List   Diagnosis Date Noted  . Anal itching 12/14/2015  . Rectal pain 10/03/2015  . Anal fissure 10/03/2015  . Hemorrhoid 12/13/2014    Past Surgical History:  Procedure Laterality Date  . KNEE ARTHROSCOPY Right    knee cap   . WISDOM TOOTH EXTRACTION         Home Medications    Prior to Admission medications   Medication Sig Start Date End Date Taking? Authorizing Provider  albuterol (PROVENTIL HFA;VENTOLIN HFA) 108 (90 Base) MCG/ACT inhaler Inhale 2 puffs into the lungs every 6 (six) hours as needed for wheezing or shortness of breath. 09/06/16   Eustaquio Maize, MD  cyclobenzaprine (FLEXERIL) 10 MG tablet Take 1 tablet (10 mg total) by mouth 3 (three) times daily as needed for muscle spasms. 09/06/16   Eustaquio Maize, MD  naproxen (NAPROSYN) 500 MG tablet Take 1 tablet (500 mg total) by mouth 2 (two) times daily. 01/20/17   Keshonna Valvo, PA-C  nystatin-triamcinolone (MYCOLOG II) cream Apply generous amount 3 times daily to the perianal area. Patient not taking: Reported on 09/06/2016 12/14/15   Zehr, Janett Billow D, PA-C  psyllium (METAMUCIL) 58.6 %  powder Take 1 packet by mouth 3 (three) times daily.    [provider]  triamcinolone cream (KENALOG) 0.1 % APPLY GENEROUS AMOUNT 3 TIMES A DAY TO PERIANAL AREA. Patient not taking: Reported on 06/27/2016 05/22/16   Zehr, Laban Emperor, PA-C    Family History Family History  Problem Relation Age of Onset  . Diabetes Paternal Grandfather   . Colon cancer Neg Hx     Social History Social History  Substance Use Topics  . Smoking status: Former Smoker    Packs/day: 0.25    Types: Cigarettes    Start date: 02/09/1994    Quit date: 02/10/1996  . Smokeless tobacco: Former Systems developer  . Alcohol use 0.0 oz/week     Comment: rare     Allergies   Patient has no known allergies.   Review of Systems Review of Systems  Constitutional: Negative for chills and fever.  Musculoskeletal: Positive for arthralgias, gait problem and joint swelling. Negative for myalgias.  Skin: Negative for color change, rash and wound.  Neurological: Negative for weakness and numbness.     Physical Exam Updated Vital Signs BP 112/72 (BP Location: Left Arm)   Pulse 76   Temp 98.3 F (36.8 C) (Oral)   Resp 16   Ht 6\' 1"  (1.854 m)   Wt 97.5 kg (215 lb)   SpO2 99%   BMI 28.37  kg/m   Physical Exam  Constitutional: He appears well-developed and well-nourished. No distress.  HENT:  Head: Normocephalic and atraumatic.  Eyes: Conjunctivae and EOM are normal. No scleral icterus.  Neck: Normal range of motion.  Pulmonary/Chest: Effort normal. No respiratory distress.  Musculoskeletal: He exhibits edema and tenderness. He exhibits no deformity.  Tenderness to palpation of the lateral malleolus of the left ankle with notable edema noted. Pain with flexion and extension of ankle. No signs of redness, warmth or coolness of the joint. 2+ DP pulses noted. Sensation intact to light touch in bilateral lower extremities. Negative calf tenderness.  Neurological: He is alert.  Skin: No rash noted. He is not  diaphoretic.  Psychiatric: He has a normal mood and affect.  Nursing note and vitals reviewed.    ED Treatments / Results  Labs (all labs ordered are listed, but only abnormal results are displayed) Labs Reviewed - No data to display  EKG  EKG Interpretation None       Radiology Dg Ankle Complete Left  Result Date: 01/20/2017 CLINICAL DATA:  Fall during hiking this morning. Left ankle pain and swelling. Initial encounter. EXAM: LEFT ANKLE COMPLETE - 3+ VIEW COMPARISON:  None. FINDINGS: There is no evidence of fracture, dislocation, or joint effusion. There is no evidence of arthropathy or other focal bone abnormality. Prominent lateral soft tissue swelling noted. IMPRESSION: Lateral soft tissue swelling. No evidence of fracture or dislocation . Electronically Signed   By: Earle Gell M.D.   On: 01/20/2017 14:33    Procedures Procedures (including critical care time)  Medications Ordered in ED Medications  naproxen (NAPROSYN) tablet 500 mg (500 mg Oral Given 01/20/17 1517)     Initial Impression / Assessment and Plan / ED Course  I have reviewed the triage vital signs and the nursing notes.  Pertinent labs & imaging results that were available during my care of the patient were reviewed by me and considered in my medical decision making (see chart for details).     Patient presents to ED for evaluation of left ankle pain and swelling after twisting injury that occurred prior to arrival. His been ambulatory with pain since incident. No previous fracture, dislocation or procedure in the area. On physical exam there is tenderness to palpation of the lateral malleolus of the left ankle however no color or temperature change noted. He has pain with range of motion although able to perform. He is afebrile with no history of fever. States that he felt fine prior to the incident occurring. I have low suspicion for septic joint been the cause of his ankle swelling and pain based on  history and physical exam findings. X-ray returned as negative. Patient declines narcotic pain medication at this time and states that he would rather take anti-inflammatories. He states that he does have crutches at home that he can use. We'll place an ankle brace and given anti-inflammatories as well as orthopedic follow up. Patient appears stable for discharge at this time. Strict return precautions given.  Final Clinical Impressions(s) / ED Diagnoses   Final diagnoses:  Sprain of left ankle, unspecified ligament, initial encounter    New Prescriptions New Prescriptions   NAPROXEN (NAPROSYN) 500 MG TABLET    Take 1 tablet (500 mg total) by mouth 2 (two) times daily.     Delia Heady, PA-C 01/20/17 1525    Daleen Bo, MD 01/20/17 2024

## 2017-01-20 NOTE — ED Triage Notes (Signed)
Pt reports out hiking, trip and fall causing injury to left ankle at 10 am today. Pt presents with swelling to left ankle.

## 2017-01-23 ENCOUNTER — Ambulatory Visit (INDEPENDENT_AMBULATORY_CARE_PROVIDER_SITE_OTHER): Payer: 59 | Admitting: Orthopedic Surgery

## 2017-01-24 ENCOUNTER — Ambulatory Visit (INDEPENDENT_AMBULATORY_CARE_PROVIDER_SITE_OTHER): Payer: 59 | Admitting: Physician Assistant

## 2017-01-28 ENCOUNTER — Ambulatory Visit (INDEPENDENT_AMBULATORY_CARE_PROVIDER_SITE_OTHER): Payer: 59 | Admitting: Physician Assistant

## 2017-01-28 ENCOUNTER — Ambulatory Visit (INDEPENDENT_AMBULATORY_CARE_PROVIDER_SITE_OTHER): Payer: 59 | Admitting: Orthopaedic Surgery

## 2017-01-28 ENCOUNTER — Encounter (INDEPENDENT_AMBULATORY_CARE_PROVIDER_SITE_OTHER): Payer: Self-pay | Admitting: Physician Assistant

## 2017-01-28 VITALS — Ht 73.0 in | Wt 215.0 lb

## 2017-01-28 DIAGNOSIS — S93402D Sprain of unspecified ligament of left ankle, subsequent encounter: Secondary | ICD-10-CM

## 2017-01-28 NOTE — Progress Notes (Signed)
Office Visit Note   Patient: James Hull           Date of Birth: October 02, 1975           MRN: [id number] Visit Date: 01/28/2017              Requested by: Dettinger, Fransisca Kaufmann, MD [place], [place] [address] PCP: Dettinger, Fransisca Kaufmann, MD   Assessment & Plan: Visit Diagnoses:  1. Sprain of left ankle, unspecified ligament, subsequent encounter     Plan: Continue the ASO brace all the time except for when sleeping for the next 2 weeks.  He will wean out of it inside home only wearing it whenever he is outside.  My primary refrain from any high impact activities.  Ice the ankle for 15 minutes 2-3 times daily.    Follow-Up Instructions: Return in about 4 weeks (around 02/25/2017).   Orders:  No orders of the defined types were placed in this encounter.  No orders of the defined types were placed in this encounter.     Procedures: No procedures performed   Clinical Data: No additional findings.   Subjective: Chief Complaint  Patient presents with  . Left Ankle - Pain    HPI James Hull is a 41 year old male who was running down pilot [place] and inverted his left ankle on 01/12/2017.  He was seen with [facility] where radiographs were obtained.  Personally reviewed these radiographs and there is no acute fracture no bony abnormalities talus well located within the ankle mortise and no diastases.  I reviewed the films with the patient today.  Been wearing an ASO brace.  States he has some swelling and some pain and actually ambulate on the ankle.  He had no other injury. Review of Systems No fevers chills shortness of breath chest pain  Objective: Vital Signs: Ht 6\' 1"  (1.854 m)   Wt 215 lb (97.5 kg)   BMI 28.37 kg/m   Physical Exam  Constitutional: He is oriented to person, place, and time. He appears well-developed and well-nourished. No distress.  Cardiovascular: Intact distal pulses.  Pulmonary/Chest: Effort normal.  Neurological: He is alert and  oriented to person, place, and time.  Skin: He is not diaphoretic.    Ortho Exam Left ankle positive edema compared to the right.  He has some ecchymosis over the anterior ankle.  Tenderness over the anterior talofibular ligament.  Achilles is intact and nontender.  He has no tenderness over the medial or lateral malleoli.  No tenderness over the calcaneus.  Left calf is supple nontender.  No tenderness over the proximal tib-fib. Specialty Comments:  No specialty comments available.  Imaging: No results found.   PMFS History: Patient Active Problem List   Diagnosis Date Noted  . Anal itching 12/14/2015  . Rectal pain 10/03/2015  . Anal fissure 10/03/2015  . Hemorrhoid 12/13/2014   Past Medical History:  Diagnosis Date  . Bronchitis   . Knee injury     Family History  Problem Relation Age of Onset  . Diabetes Paternal Grandfather   . Colon cancer Neg Hx     Past Surgical History:  Procedure Laterality Date  . KNEE ARTHROSCOPY Right    knee cap   . WISDOM TOOTH EXTRACTION     Social History   Occupational History  . Not on file  Tobacco Use  . Smoking status: Former Smoker    Packs/day: 0.25    Types: Cigarettes  Start date: 02/09/1994    Last attempt to quit: 02/10/1996    Years since quitting: 20.9  . Smokeless tobacco: Former Network engineer and Sexual Activity  . Alcohol use: Yes    Alcohol/week: 0.0 oz    Comment: rare  . Drug use: No  . Sexual activity: Not on file

## 2017-02-25 ENCOUNTER — Ambulatory Visit (INDEPENDENT_AMBULATORY_CARE_PROVIDER_SITE_OTHER): Payer: 59 | Admitting: Physician Assistant

## 2017-02-27 ENCOUNTER — Ambulatory Visit (INDEPENDENT_AMBULATORY_CARE_PROVIDER_SITE_OTHER): Payer: 59 | Admitting: Family Medicine

## 2017-05-29 DIAGNOSIS — J4520 Mild intermittent asthma, uncomplicated: Secondary | ICD-10-CM | POA: Diagnosis not present

## 2017-08-12 ENCOUNTER — Ambulatory Visit (INDEPENDENT_AMBULATORY_CARE_PROVIDER_SITE_OTHER): Payer: 59

## 2017-08-12 ENCOUNTER — Other Ambulatory Visit: Payer: Self-pay | Admitting: Physician Assistant

## 2017-08-12 ENCOUNTER — Encounter: Payer: Self-pay | Admitting: Physician Assistant

## 2017-08-12 ENCOUNTER — Ambulatory Visit: Payer: 59 | Admitting: Physician Assistant

## 2017-08-12 VITALS — BP 112/79 | HR 65 | Temp 97.6°F | Ht 73.0 in | Wt 221.2 lb

## 2017-08-12 DIAGNOSIS — R0789 Other chest pain: Secondary | ICD-10-CM

## 2017-08-12 DIAGNOSIS — S2232XA Fracture of one rib, left side, initial encounter for closed fracture: Secondary | ICD-10-CM

## 2017-08-12 DIAGNOSIS — R0781 Pleurodynia: Secondary | ICD-10-CM | POA: Diagnosis not present

## 2017-08-12 DIAGNOSIS — W19XXXA Unspecified fall, initial encounter: Secondary | ICD-10-CM

## 2017-08-12 DIAGNOSIS — S299XXA Unspecified injury of thorax, initial encounter: Secondary | ICD-10-CM | POA: Diagnosis not present

## 2017-08-12 NOTE — Progress Notes (Signed)
BP 112/79   Pulse 65   Temp 97.6 F (36.4 C) (Oral)   Ht 6\' 1"  (1.854 m)   Wt 221 lb 4 oz (100.4 kg)   BMI 29.19 kg/m     Subjective:    Patient ID: James Hull, male    DOB: November 17, 1975, 42 y.o.   MRN: [id number]  HPI: James Hull is a 42 y.o. male presenting on 08/12/2017 for Fall (pt here today c/o left side rib pain after falling on Friday) 3 days ago the patient was riding some type of skateboard and had a fall where he landed on his left lateral side.  He did not hear a crack but did feel significant pain.  He also has some soreness in his area beside the sternum.  He did continue to do lots of activities but the pain is gotten worse and worse.   Past Medical History:  Diagnosis Date  . Bronchitis   . Knee injury    Relevant past medical, surgical, family and social history reviewed and updated as indicated. Interim medical history since our last visit reviewed. Allergies and medications reviewed and updated. DATA REVIEWED: CHART IN EPIC  Family History reviewed for pertinent findings.  Review of Systems  Constitutional: Negative.  Negative for appetite change and fatigue.  Eyes: Negative for pain and visual disturbance.  Respiratory: Negative.  Negative for cough, chest tightness, shortness of breath and wheezing.   Cardiovascular: Positive for chest pain. Negative for palpitations and leg swelling.  Gastrointestinal: Negative.  Negative for abdominal pain, diarrhea, nausea and vomiting.  Genitourinary: Negative.   Musculoskeletal: Positive for arthralgias and myalgias.  Skin: Negative.  Negative for color change and rash.  Neurological: Negative.  Negative for weakness, numbness and headaches.  Psychiatric/Behavioral: Negative.     Allergies as of 08/12/2017   No Known Allergies     Medication List        Accurate as of 08/12/17 10:04 AM. Always use your most recent med list.          albuterol 108 (90 Base) MCG/ACT inhaler Commonly  known as:  PROVENTIL HFA;VENTOLIN HFA Inhale 2 puffs into the lungs every 6 (six) hours as needed for wheezing or shortness of breath.   psyllium 58.6 % powder Commonly known as:  METAMUCIL Take 1 packet by mouth 3 (three) times daily.          Objective:    BP 112/79   Pulse 65   Temp 97.6 F (36.4 C) (Oral)   Ht 6\' 1"  (1.854 m)   Wt 221 lb 4 oz (100.4 kg)   BMI 29.19 kg/m    No Known Allergies  Wt Readings from Last 3 Encounters:  08/12/17 221 lb 4 oz (100.4 kg)  01/28/17 215 lb (97.5 kg)  01/20/17 215 lb (97.5 kg)    Physical Exam  Constitutional: He appears well-developed and well-nourished. No distress.  HENT:  Head: Normocephalic and atraumatic.  Eyes: Pupils are equal, round, and reactive to light. Conjunctivae and EOM are normal.  Cardiovascular: Normal rate, regular rhythm and normal heart sounds.  Pulmonary/Chest: Effort normal and breath sounds normal. No stridor. No respiratory distress. He has no wheezes. He exhibits tenderness.  Musculoskeletal:       Thoracic back: He exhibits tenderness and pain. He exhibits no swelling and no deformity.       Back:       Arms: Skin: Skin is warm and dry.  Psychiatric: He has  a normal mood and affect. His behavior is normal.  Nursing note and vitals reviewed.       Assessment & Plan:   1. Fall, initial encounter - DG Ribs Unilateral W/Chest Left; Future  2. Closed fracture of one rib of left side, initial encounter Conservative therapy Ibuprofen Rests stretching  Continue all other maintenance medications as listed above.  Follow up plan: Return if symptoms worsen or fail to improve.  Educational handout given for rib fracture  Terald Sleeper PA-C [facility] [address]  [place], [place] [address] [phone]   08/12/2017, 10:04 AM

## 2017-09-04 ENCOUNTER — Encounter: Payer: Self-pay | Admitting: Physician Assistant

## 2017-09-04 ENCOUNTER — Ambulatory Visit: Payer: 59 | Admitting: Physician Assistant

## 2017-09-04 VITALS — BP 119/72 | HR 70 | Temp 97.9°F | Ht 73.0 in | Wt 224.2 lb

## 2017-09-04 DIAGNOSIS — J4520 Mild intermittent asthma, uncomplicated: Secondary | ICD-10-CM

## 2017-09-04 DIAGNOSIS — J9801 Acute bronchospasm: Secondary | ICD-10-CM

## 2017-09-04 MED ORDER — ALBUTEROL SULFATE HFA 108 (90 BASE) MCG/ACT IN AERS: 2.0000 | INHALATION_SPRAY | Freq: Four times a day (QID) | RESPIRATORY_TRACT | 2 refills | Status: DC | PRN

## 2017-09-04 MED ORDER — BUDESONIDE-FORMOTEROL FUMARATE 160-4.5 MCG/ACT IN AERO: 2.0000 | INHALATION_SPRAY | Freq: Two times a day (BID) | RESPIRATORY_TRACT | 3 refills | Status: DC

## 2017-09-06 NOTE — Progress Notes (Signed)
BP 119/72   Pulse 70   Temp 97.9 F (36.6 C) (Oral)   Ht 6\' 1"  (1.854 m)   Wt 224 lb 3.2 oz (101.7 kg)   SpO2 97%   BMI 29.58 kg/m    Subjective:    Patient ID: James Hull, male    DOB: 1975-06-28, 42 y.o.   MRN: [id number]  HPI: James Hull is a 42 y.o. male presenting on 09/04/2017 for asthma flare up  This patient comes in for recent flareup of his asthma.  Is been many years since he had brought problems.  He did have an albuterol at home and has been using it a fair amount.  He does feel very nervous and anxious on it.  He has never had to use a controller.  He recently did have broken ribs and had a change in his normal breathing coughing.  Past Medical History:  Diagnosis Date  . Bronchitis   . Knee injury    Relevant past medical, surgical, family and social history reviewed and updated as indicated. Interim medical history since our last visit reviewed. Allergies and medications reviewed and updated. DATA REVIEWED: CHART IN EPIC  Family History reviewed for pertinent findings.  Review of Systems  Constitutional: Negative.  Negative for appetite change and fatigue.  HENT: Negative.   Eyes: Negative.  Negative for pain and visual disturbance.  Respiratory: Positive for cough, chest tightness and wheezing.   Cardiovascular: Negative.  Negative for chest pain, palpitations and leg swelling.  Gastrointestinal: Negative.  Negative for abdominal pain, diarrhea, nausea and vomiting.  Endocrine: Negative.   Genitourinary: Negative.   Musculoskeletal: Negative.   Skin: Negative.  Negative for color change and rash.  Neurological: Negative.  Negative for weakness, numbness and headaches.  Psychiatric/Behavioral: Negative.     Allergies as of 09/04/2017   No Known Allergies     Medication List        Accurate as of 09/04/17 11:59 PM. Always use your most recent med list.          albuterol 108 (90 Base) MCG/ACT inhaler Commonly known as:   PROVENTIL HFA;VENTOLIN HFA Inhale 2 puffs into the lungs every 6 (six) hours as needed for wheezing or shortness of breath.   budesonide-formoterol 160-4.5 MCG/ACT inhaler Commonly known as:  SYMBICORT Inhale 2 puffs into the lungs 2 (two) times daily.   psyllium 58.6 % powder Commonly known as:  METAMUCIL Take 1 packet by mouth 3 (three) times daily.          Objective:    BP 119/72   Pulse 70   Temp 97.9 F (36.6 C) (Oral)   Ht 6\' 1"  (1.854 m)   Wt 224 lb 3.2 oz (101.7 kg)   SpO2 97%   BMI 29.58 kg/m   No Known Allergies  Wt Readings from Last 3 Encounters:  09/04/17 224 lb 3.2 oz (101.7 kg)  08/12/17 221 lb 4 oz (100.4 kg)  01/28/17 215 lb (97.5 kg)    Physical Exam  Constitutional: He appears well-developed and well-nourished.  HENT:  Head: Normocephalic and atraumatic.  Eyes: Pupils are equal, round, and reactive to light. Conjunctivae and EOM are normal.  Neck: Normal range of motion. Neck supple.  Cardiovascular: Normal rate, regular rhythm and normal heart sounds.  Pulmonary/Chest: Effort normal and breath sounds normal.  Abdominal: Soft. Bowel sounds are normal.  Musculoskeletal: Normal range of motion.  Skin: Skin is warm and dry.  Assessment & Plan:   1. Mild intermittent asthma without complication - albuterol (PROVENTIL HFA;VENTOLIN HFA) 108 (90 Base) MCG/ACT inhaler; Inhale 2 puffs into the lungs every 6 (six) hours as needed for wheezing or shortness of breath.  Dispense: 1 Inhaler; Refill: 2 - budesonide-formoterol (SYMBICORT) 160-4.5 MCG/ACT inhaler; Inhale 2 puffs into the lungs 2 (two) times daily.  Dispense: 1 Inhaler; Refill: 3  2. Bronchospasm - albuterol (PROVENTIL HFA;VENTOLIN HFA) 108 (90 Base) MCG/ACT inhaler; Inhale 2 puffs into the lungs every 6 (six) hours as needed for wheezing or shortness of breath.  Dispense: 1 Inhaler; Refill: 2 - budesonide-formoterol (SYMBICORT) 160-4.5 MCG/ACT inhaler; Inhale 2 puffs into the lungs 2  (two) times daily.  Dispense: 1 Inhaler; Refill: 3   Continue all other maintenance medications as listed above.  Follow up plan: No follow-ups on file.  Educational handout given for [place] PA-C [place] [facility] [address]  [place], [place] [address] [phone]   09/06/2017, 11:15 AM

## 2018-02-06 ENCOUNTER — Other Ambulatory Visit: Payer: Self-pay | Admitting: Physician Assistant

## 2018-02-06 DIAGNOSIS — J9801 Acute bronchospasm: Secondary | ICD-10-CM

## 2018-02-06 DIAGNOSIS — J4520 Mild intermittent asthma, uncomplicated: Secondary | ICD-10-CM

## 2018-02-07 NOTE — Telephone Encounter (Signed)
Pharmacy comment:  Alternative Requested:PATIENT'S INSURANCE WILL ONLY PAY FOR VENTOLIN NOW, CAN WE GET A NEW RX

## 2018-02-12 MED ORDER — ALBUTEROL SULFATE HFA 108 (90 BASE) MCG/ACT IN AERS
INHALATION_SPRAY | RESPIRATORY_TRACT | 2 refills | Status: DC
Start: 2018-02-12 — End: 2018-08-13

## 2018-02-12 NOTE — Addendum Note (Signed)
Addended by: Antonietta Barcelona D on: 02/12/2018 09:01 AM   Modules accepted: Orders

## 2018-08-13 ENCOUNTER — Ambulatory Visit (INDEPENDENT_AMBULATORY_CARE_PROVIDER_SITE_OTHER): Payer: 59 | Admitting: Nurse Practitioner

## 2018-08-13 ENCOUNTER — Encounter: Payer: Self-pay | Admitting: Nurse Practitioner

## 2018-08-13 ENCOUNTER — Other Ambulatory Visit: Payer: Self-pay | Admitting: Physician Assistant

## 2018-08-13 ENCOUNTER — Other Ambulatory Visit: Payer: Self-pay

## 2018-08-13 DIAGNOSIS — J4520 Mild intermittent asthma, uncomplicated: Secondary | ICD-10-CM

## 2018-08-13 DIAGNOSIS — J9801 Acute bronchospasm: Secondary | ICD-10-CM

## 2018-08-13 MED ORDER — ALBUTEROL SULFATE HFA 108 (90 BASE) MCG/ACT IN AERS
INHALATION_SPRAY | RESPIRATORY_TRACT | 2 refills | Status: DC
Start: 2018-08-13 — End: 2019-12-11

## 2018-08-13 NOTE — Telephone Encounter (Signed)
Has apt today 

## 2018-08-13 NOTE — Telephone Encounter (Signed)
Dettinger. NTBS LOV 09/04/17

## 2018-08-13 NOTE — Progress Notes (Signed)
   Virtual Visit via telephone Note  I connected with Ebbie Ridge on 08/13/18 at 2:00PM by telephone and verified that I am speaking with the correct person using two identifiers. DAYVEN LINSLEY is currently located at home and no one is currently with her during visit. The provider, Mary-Margaret Hassell Done, FNP is located in their office at time of visit.  I discussed the limitations, risks, security and privacy concerns of performing an evaluation and management service by telephone and the availability of in person appointments. I also discussed with the patient that there may be a patient responsible charge related to this service. The patient expressed understanding and agreed to proceed.   History and Present Illness:   Chief Complaint: Asthma   HPI Patient calls in today stating that he needs a refill of ventolin. He uses several times a month because he exercises a lot. He does not use his symbicort anymore. He says when he has asthma attack he panics which makes breathing worse. He tries to keep ventolin in his truck, his car and on his bike.    Review of Systems  Respiratory: Positive for cough and shortness of breath.   Cardiovascular: Negative.   Neurological: Negative.   Psychiatric/Behavioral: Negative.   All other systems reviewed and are negative.    Observations/Objective: Alert and oriented No distress No SOB noted during conversation  Assessment and Plan: Ebbie Ridge in today with chief complaint of Asthma   1. Mild intermittent asthma without complication Only use as rescuer inhaler - albuterol (VENTOLIN HFA) 108 (90 Base) MCG/ACT inhaler; TAKE 2 PUFFS BY MOUTH EVERY 6 HOURS AS NEEDED FOR WHEEZE OR SHORTNESS OF BREATH  Dispense: 18 Inhaler; Refill: 2   Follow Up Instructions: prn    I discussed the assessment and treatment plan with the patient. The patient was provided an opportunity to ask questions and all were answered. The patient  agreed with the plan and demonstrated an understanding of the instructions.   The patient was advised to call back or seek an in-person evaluation if the symptoms worsen or if the condition fails to improve as anticipated.  The above assessment and management plan was discussed with the patient. The patient verbalized understanding of and has agreed to the management plan. Patient is aware to call the clinic if symptoms persist or worsen. Patient is aware when to return to the clinic for a follow-up visit. Patient educated on when it is appropriate to go to the emergency department.   Time call ended:  2:15 pm  I provided 15 minutes of non-face-to-face time during this encounter.    Mary-Margaret Hassell Done, FNP

## 2018-12-14 IMAGING — CR DG ANKLE COMPLETE 3+V*L*
3 series · 3 of 3 positions shown · non-contrast
Comparison: None.

CLINICAL DATA: Fall during hiking this morning. Left ankle pain and
swelling. Initial encounter.

EXAM:
LEFT ANKLE COMPLETE - 3+ VIEW

[ankle ap]
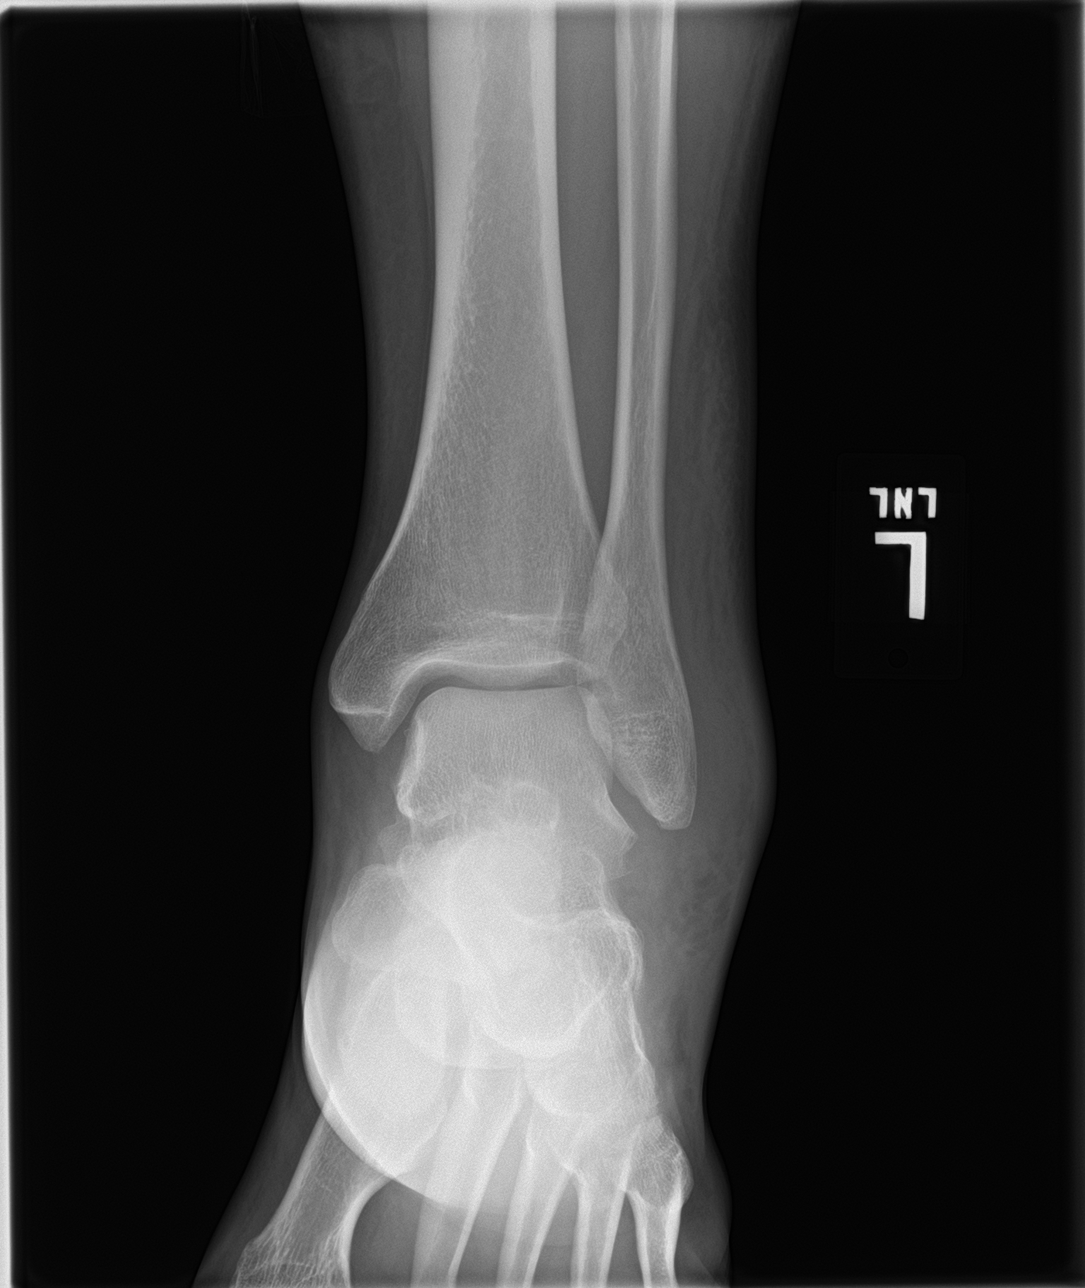

[ankle obl]
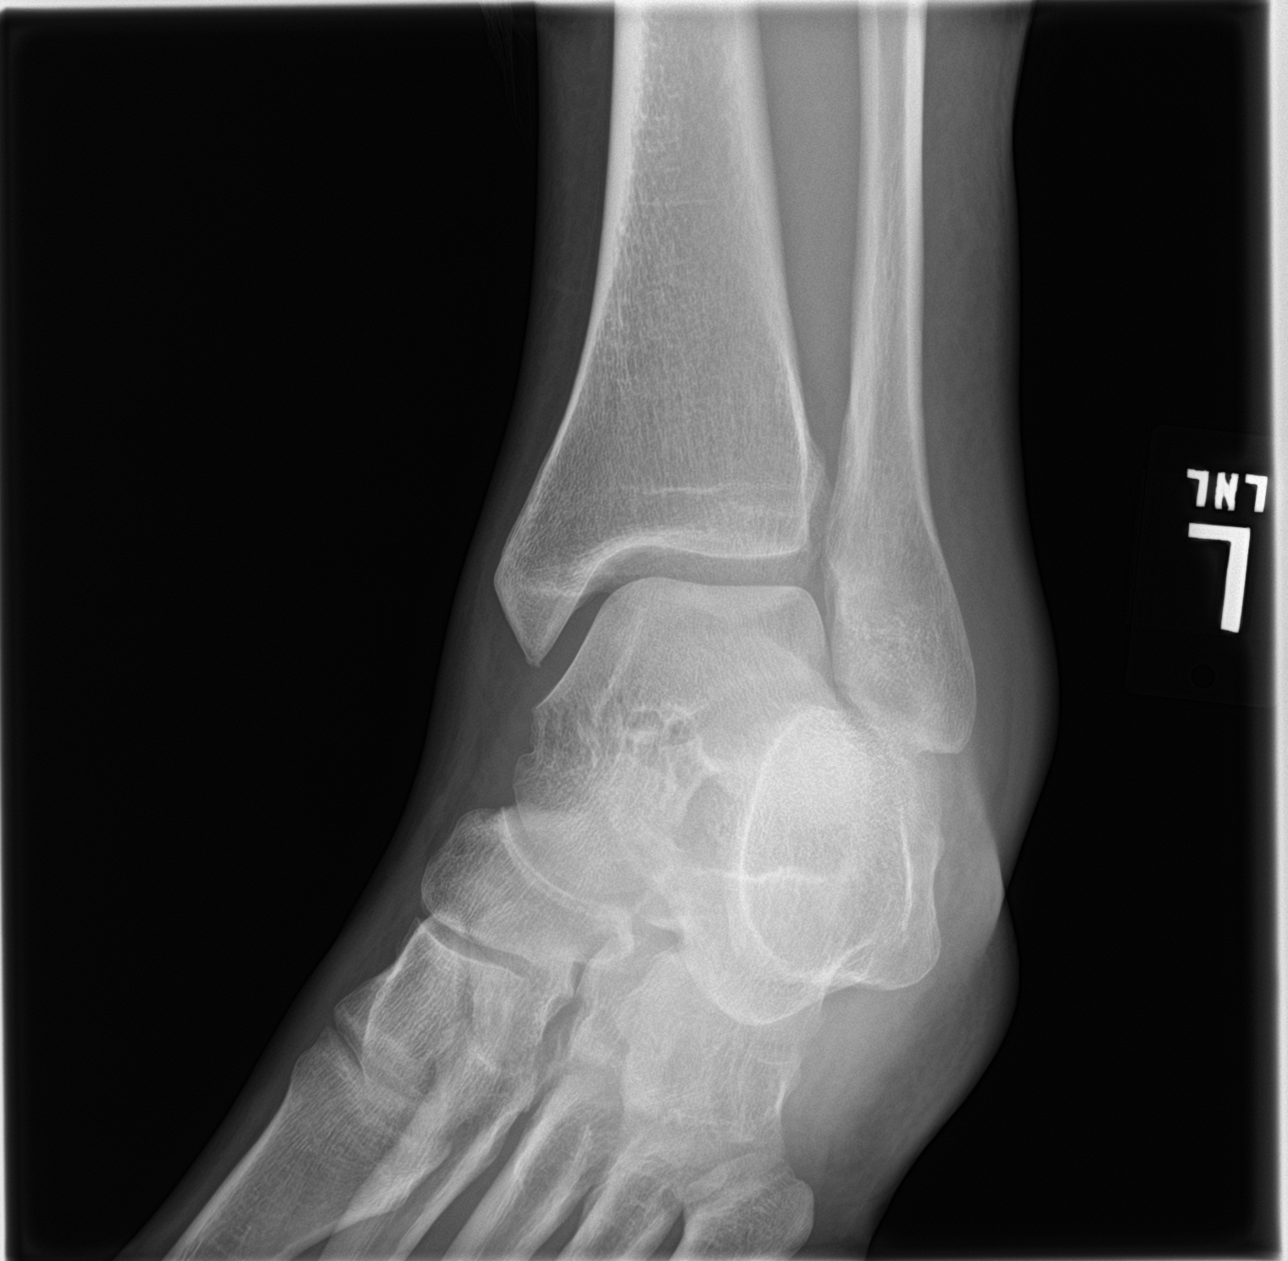

[ankle lat]
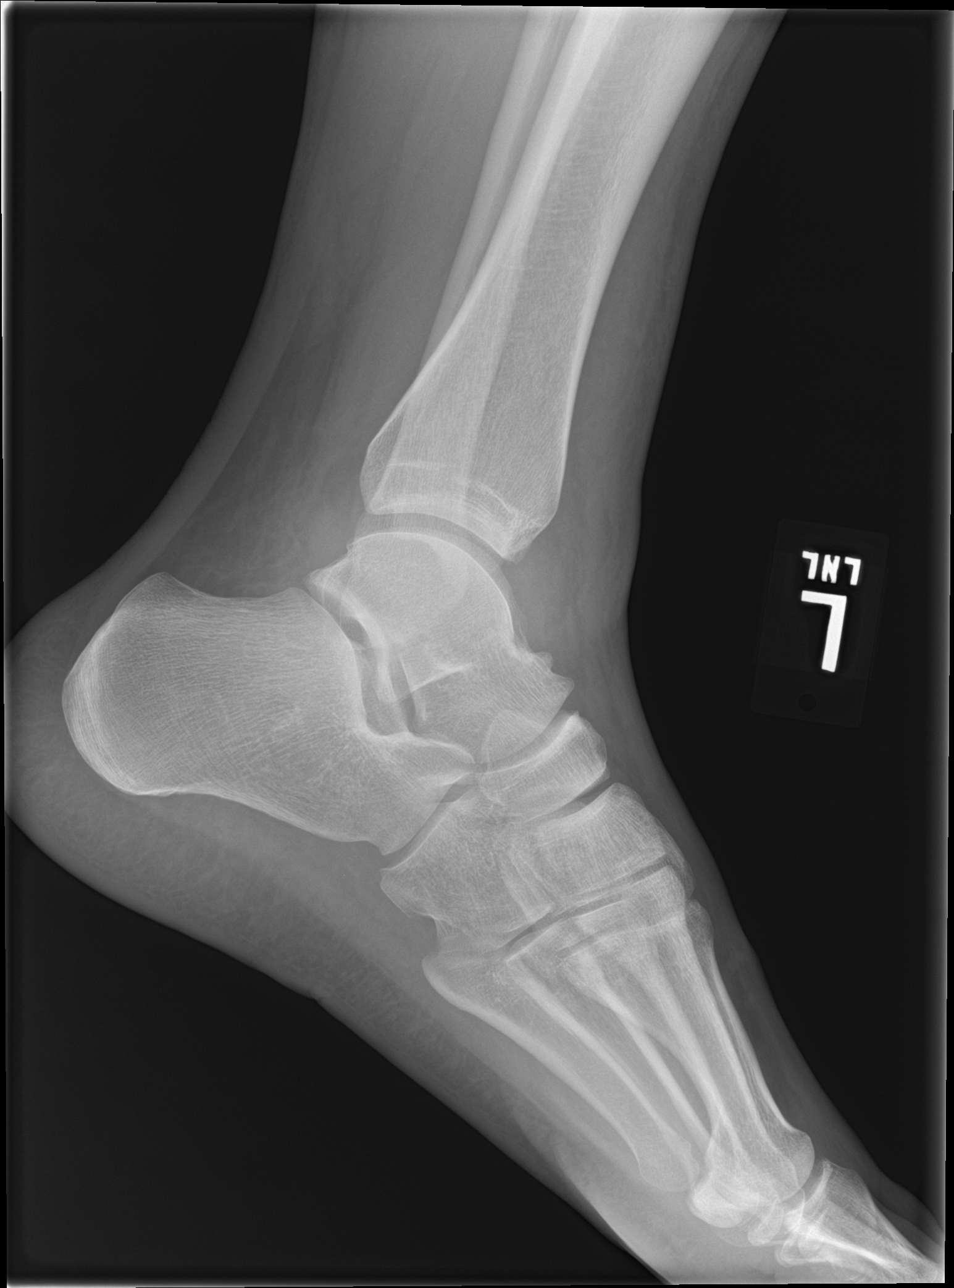

[3 of 3 positions shown; findings below may reference images not displayed]

FINDINGS: There is no evidence of fracture, dislocation, or joint effusion.
There is no evidence of arthropathy or other focal bone abnormality.
Prominent lateral soft tissue swelling noted.
IMPRESSION: Lateral soft tissue swelling. No evidence of fracture or dislocation
.

## 2019-12-11 ENCOUNTER — Other Ambulatory Visit: Payer: Self-pay | Admitting: Nurse Practitioner

## 2019-12-11 ENCOUNTER — Telehealth: Payer: Self-pay | Admitting: Family Medicine

## 2019-12-11 DIAGNOSIS — J4520 Mild intermittent asthma, uncomplicated: Secondary | ICD-10-CM

## 2019-12-11 DIAGNOSIS — J9801 Acute bronchospasm: Secondary | ICD-10-CM

## 2019-12-11 MED ORDER — ALBUTEROL SULFATE HFA 108 (90 BASE) MCG/ACT IN AERS: INHALATION_SPRAY | RESPIRATORY_TRACT | 0 refills | Status: DC

## 2019-12-11 MED ORDER — BUDESONIDE-FORMOTEROL FUMARATE 160-4.5 MCG/ACT IN AERO: 2.0000 | INHALATION_SPRAY | Freq: Two times a day (BID) | RESPIRATORY_TRACT | 0 refills | Status: DC

## 2019-12-11 NOTE — Telephone Encounter (Signed)
  Prescription Request  12/11/2019  What is the name of the medication or equipment? Inhaler  Have you contacted your pharmacy to request a refill? (if applicable) Yes  Which pharmacy would you like this sent to? [facility]  Pt lost one of his refills. Needs another. Pharmacy says his refills have expired. Pt scheduled televisit with Dr Dettinger on 01/28/20 which was first available. Needs enough to last him at least until his appt.   Patient notified that their request is being sent to the clinical staff for review and that they should receive a response within 2 business days.

## 2019-12-11 NOTE — Telephone Encounter (Signed)
Sent in

## 2020-01-09 ENCOUNTER — Other Ambulatory Visit: Payer: Self-pay | Admitting: Nurse Practitioner

## 2020-01-09 DIAGNOSIS — J4520 Mild intermittent asthma, uncomplicated: Secondary | ICD-10-CM

## 2020-01-28 ENCOUNTER — Encounter: Payer: Self-pay | Admitting: Family Medicine

## 2020-01-28 ENCOUNTER — Ambulatory Visit (INDEPENDENT_AMBULATORY_CARE_PROVIDER_SITE_OTHER): Payer: 59 | Admitting: Family Medicine

## 2020-01-28 DIAGNOSIS — J4520 Mild intermittent asthma, uncomplicated: Secondary | ICD-10-CM

## 2020-01-28 MED ORDER — ALBUTEROL SULFATE HFA 108 (90 BASE) MCG/ACT IN AERS: 1.0000 | INHALATION_SPRAY | RESPIRATORY_TRACT | 5 refills | Status: DC | PRN

## 2020-01-28 NOTE — Progress Notes (Signed)
   Virtual Visit via telephone Note  I connected with James Hull on 01/28/20 at 1300 by telephone and verified that I am speaking with the correct person using two identifiers. James Hull is currently located at restaurant and patient are currently with her during visit. The provider, Fransisca Kaufmann Lyndsi Altic, MD is located in their office at time of visit.  Call ended at 1311  I discussed the limitations, risks, security and privacy concerns of performing an evaluation and management service by telephone and the availability of in person appointments. I also discussed with the patient that there may be a patient responsible charge related to this service. The patient expressed understanding and agreed to proceed.   History and Present Illness: Asthma recheck Patient has been using albuterol.  He is leaving a house soon that has a lot of cats.  He uses the albuterol about 1 time per week. He is getting a divorce soon. He does not use an antihistamine.   No diagnosis found.  Outpatient Encounter Medications as of 01/28/2020  Medication Sig  . albuterol (VENTOLIN HFA) 108 (90 Base) MCG/ACT inhaler TAKE 2 PUFFS BY MOUTH EVERY 6 HOURS AS NEEDED FOR WHEEZE OR SHORTNESS OF BREATH  . budesonide-formoterol (SYMBICORT) 160-4.5 MCG/ACT inhaler Inhale 2 puffs into the lungs 2 (two) times daily.  . psyllium (METAMUCIL) 58.6 % powder Take 1 packet by mouth 3 (three) times daily.   Facility-Administered Encounter Medications as of 01/28/2020  Medication  . 0.9 %  sodium chloride infusion    Review of Systems  Constitutional: Negative for chills and fever.  HENT: Positive for congestion. Negative for rhinorrhea, sinus pressure and sinus pain.   Respiratory: Positive for cough and wheezing. Negative for shortness of breath.   Cardiovascular: Negative for chest pain and leg swelling.  Musculoskeletal: Negative for back pain and gait problem.  Skin: Negative for rash.  All other systems  reviewed and are negative.   Observations/Objective: Patient sounds comfortable and in no acute distress.  Assessment and Plan: Problem List Items Addressed This Visit      Respiratory   Mild intermittent asthma without complication - Primary   Relevant Medications   albuterol (VENTOLIN HFA) 108 (90 Base) MCG/ACT inhaler       Follow up plan: Return if symptoms worsen or fail to improve, for physical as soon as possible.     I discussed the assessment and treatment plan with the patient. The patient was provided an opportunity to ask questions and all were answered. The patient agreed with the plan and demonstrated an understanding of the instructions.   The patient was advised to call back or seek an in-person evaluation if the symptoms worsen or if the condition fails to improve as anticipated.  The above assessment and management plan was discussed with the patient. The patient verbalized understanding of and has agreed to the management plan. Patient is aware to call the clinic if symptoms persist or worsen. Patient is aware when to return to the clinic for a follow-up visit. Patient educated on when it is appropriate to go to the emergency department.    I provided 11 minutes of non-face-to-face time during this encounter.    Worthy Rancher, MD

## 2020-03-09 ENCOUNTER — Telehealth: Payer: Self-pay

## 2020-03-09 ENCOUNTER — Ambulatory Visit (INDEPENDENT_AMBULATORY_CARE_PROVIDER_SITE_OTHER): Payer: 59 | Admitting: Family Medicine

## 2020-03-09 DIAGNOSIS — J329 Chronic sinusitis, unspecified: Secondary | ICD-10-CM | POA: Diagnosis not present

## 2020-03-09 DIAGNOSIS — J310 Chronic rhinitis: Secondary | ICD-10-CM

## 2020-03-09 DIAGNOSIS — J029 Acute pharyngitis, unspecified: Secondary | ICD-10-CM

## 2020-03-09 MED ORDER — MAGIC MOUTHWASH W/LIDOCAINE: ORAL | 0 refills | Status: DC

## 2020-03-09 MED ORDER — PREDNISONE 10 MG (21) PO TBPK: ORAL_TABLET | ORAL | 0 refills | Status: DC

## 2020-03-09 MED ORDER — AMOXICILLIN 500 MG PO CAPS: 500.0000 mg | ORAL_CAPSULE | Freq: Two times a day (BID) | ORAL | 0 refills | Status: DC

## 2020-03-09 NOTE — Telephone Encounter (Signed)
Was on print re sent to [facility]

## 2020-03-09 NOTE — Progress Notes (Signed)
Rx for Magic mouthwash did not go electronically, gave VO to [place]

## 2020-03-09 NOTE — Telephone Encounter (Signed)
Patient informed that he may take all six pills at one time.  Did advise patient to not take at night as it may have a tendency to keep him awake.

## 2020-03-09 NOTE — Telephone Encounter (Signed)
Per patient- [facility] did not receive Rx's for Amoxicillin, magic mouthwash, or Prednisone. Please resend.

## 2020-03-09 NOTE — Addendum Note (Signed)
Addended by: Antonietta Barcelona D on: 03/09/2020 04:38 PM   Modules accepted: Orders

## 2020-03-09 NOTE — Progress Notes (Signed)
Telephone visit  Subjective: CC: sore throat PCP: [place], Fransisca Kaufmann, MD James Hull is a 44 y.o. male calls for telephone consult today. Patient provides verbal consent for consult held via phone.  Due to COVID-19 pandemic this visit was conducted virtually. This visit type was conducted due to national recommendations for restrictions regarding the COVID-19 Pandemic (e.g. social distancing, sheltering in place) in an effort to limit this patient's exposure and mitigate transmission in our community. All issues noted in this document were discussed and addressed.  A physical exam was not performed with this format.   Location of patient: home Location of provider: [facility] Others present for call: none  1. Sore throat Patient called tele-doc yesterday.  She gave him Clotrimazole for thrush.  He reports nasal congestion.  He reports irritation of soft palate.  Has reports throat pain and difficulty swallowing.  He feels like it is difficult to breath at night time.  Tonsils are not touching.  He reports that girlfriend has been sick as well.  He has gotten first 2 COVID vaccinations.  ROS: Per HPI  No Known Allergies Past Medical History:  Diagnosis Date  . Bronchitis   . Knee injury     Current Outpatient Medications:  .  albuterol (VENTOLIN HFA) 108 (90 Base) MCG/ACT inhaler, Inhale 1-2 puffs into the lungs every 4 (four) hours as needed for wheezing or shortness of breath. TAKE 2 PUFFS BY MOUTH EVERY 6 HOURS AS NEEDED FOR WHEEZE OR SHORTNESS OF BREATH, Disp: 18 g, Rfl: 5 .  psyllium (METAMUCIL) 58.6 % powder, Take 1 packet by mouth 3 (three) times daily., Disp: , Rfl:   Assessment/ Plan: 44 y.o. male   Sore throat  Rhinosinusitis  I suspect that this is a viral illness prime going to give him a pocket prescription for amoxicillin to have on hand just in case, as we cannot rule out strep throat at this point.  I am placing her on oral steroids to help with the  inflammation and a Magic mouthwash with lidocaine to soothe the sore throat.  We discussed red flag signs and symptoms including signs and symptoms of retropharyngeal abscess.  He voiced good understanding and will follow up as needed  Start time: 10:24am (second attempt; LVMx2); 11:06am End time: 11:14am  Total time spent on patient care (including telephone call/ virtual visit): 8 minutes  [place], [place] [phone]

## 2020-03-26 HISTORY — PX: FEMUR FRACTURE SURGERY: SHX633

## 2020-04-01 ENCOUNTER — Ambulatory Visit (INDEPENDENT_AMBULATORY_CARE_PROVIDER_SITE_OTHER): Payer: 59 | Admitting: Family

## 2020-04-01 ENCOUNTER — Encounter: Payer: Self-pay | Admitting: Family

## 2020-04-01 VITALS — BP 122/84 | HR 59 | Temp 95.8°F | Ht 73.0 in | Wt 199.0 lb

## 2020-04-01 DIAGNOSIS — F4321 Adjustment disorder with depressed mood: Secondary | ICD-10-CM | POA: Diagnosis not present

## 2020-04-01 DIAGNOSIS — F321 Major depressive disorder, single episode, moderate: Secondary | ICD-10-CM | POA: Diagnosis not present

## 2020-04-01 DIAGNOSIS — S0990XA Unspecified injury of head, initial encounter: Secondary | ICD-10-CM

## 2020-04-01 MED ORDER — ESCITALOPRAM OXALATE 10 MG PO TABS: 10.0000 mg | ORAL_TABLET | Freq: Every day | ORAL | 3 refills | Status: DC

## 2020-04-01 NOTE — Progress Notes (Signed)
Subjective:    Patient ID: James Hull, male    DOB: 1975-11-30, 45 y.o.   MRN: [id number]  Chief Complaint  Patient presents with  . Head Injury    Patient states he hit his head on the van door this morning.   . Depression    Patient states it has been going on since Halloween when he started having trouble with his marriage. Patient states that he was going to admitting his self last night due to suicide thoughts    Pt presents to the office today complaints of head injury and  depression. He states he has been up since 3 AM and been crying on and off. He called the suicide hot lone this morning. He reports he gave his gun away to his friend and no longer has a plan.   He reports around 7AM he was getting into his [employer] and his head on the door. He did not lose conscience ,  Have nausea or vomiting, changes gait, speech, or memory.   He is flat and feels hopeless. He states his wife and him are going through a separation. This has been very stressful for him.     He states his father passed away on Thanksgiving day.   He has an appointment with [facility] appointment Monday to establish care. He has his second therapists appointment on 04/06/20.  Head Injury  There was no blood loss. The pain is mild. Pertinent negatives include no blurred vision, disorientation, headaches, memory loss, numbness, tinnitus, vomiting or weakness.  Depression        Associated symptoms include fatigue, helplessness, hopelessness, insomnia, irritable, restlessness, decreased interest, sad and suicidal ideas (he states he had a plan, but gave his gun away. ).  Associated symptoms include no headaches.     Review of Systems  Constitutional: Positive for fatigue.  HENT: Negative for tinnitus.   Eyes: Negative for blurred vision.  Gastrointestinal: Negative for vomiting.  Neurological: Negative for weakness, numbness and headaches.  Psychiatric/Behavioral: Positive for depression and  suicidal ideas (he states he had a plan, but gave his gun away. ). Negative for memory loss. The patient has insomnia.   All other systems reviewed and are negative.      Objective:   Physical Exam Vitals reviewed.  Constitutional:      General: He is irritable. He is not in acute distress.    Appearance: He is well-developed and well-nourished.  HENT:     Head: Normocephalic.     Right Ear: Tympanic membrane normal.     Left Ear: Tympanic membrane normal.     Mouth/Throat:     Mouth: Oropharynx is clear and moist.  Eyes:     General:        Right eye: No discharge.        Left eye: No discharge.     Pupils: Pupils are equal, round, and reactive to light.  Neck:     Thyroid: No thyromegaly.  Cardiovascular:     Rate and Rhythm: Normal rate and regular rhythm.     Pulses: Intact distal pulses.     Heart sounds: Normal heart sounds. No murmur heard.   Pulmonary:     Effort: Pulmonary effort is normal. No respiratory distress.     Breath sounds: Normal breath sounds. No wheezing.  Abdominal:     General: Bowel sounds are normal. There is no distension.     Palpations: Abdomen is soft.  Tenderness: There is no abdominal tenderness.  Musculoskeletal:        General: No tenderness or edema. Normal range of motion.     Cervical back: Normal range of motion and neck supple.  Skin:    General: Skin is warm and dry.     Findings: No erythema or rash.          Comments: Small laceration with dried blood above left eye.   Neurological:     Mental Status: He is alert and oriented to person, place, and time.     Cranial Nerves: No cranial nerve deficit.     Deep Tendon Reflexes: Reflexes are normal and symmetric.  Psychiatric:        Mood and Affect: Mood and affect normal. Affect is tearful.        Behavior: Behavior normal.        Thought Content: Thought content normal.        Judgment: Judgment normal.     Comments: Crying at times       BP 122/84   Pulse (!) 59    Temp (!) 95.8 F (35.4 C) (Temporal)   Ht 6\' 1"  (1.854 m)   Wt 199 lb (90.3 kg)   SpO2 96%   BMI 26.25 kg/m      Assessment & Plan:  DECLIN RAJAN comes in today with chief complaint of Head Injury (Patient states he hit his head on the van door this morning. ) and Depression (Patient states it has been going on since Halloween when he started having trouble with his marriage. Patient states that he was going to admitting his self last night due to suicide thoughts/)   Diagnosis and orders addressed:  1. Injury of head, initial encounter  2. Depression, major, single episode, moderate (HCC) - escitalopram (LEXAPRO) 10 MG tablet; Take 1 tablet (10 mg total) by mouth daily.  Dispense: 90 tablet; Refill: 3  3. Grief - escitalopram (LEXAPRO) 10 MG tablet; Take 1 tablet (10 mg total) by mouth daily.  Dispense: 90 tablet; Refill: 3   Normal neuro exam I believe a lot of his symptoms are from his depression. He has not slept well.  Discussed red flags- any changes in gait, speech, vision, memory go to ED.   If any SI or HI go to ED.  Will start Lexapro 10 mg.  Discuss possible adverse effects.  Keep Psychiatrists appointment 04/04/20. He agrees to our plan.    Evelina Dun, FNP

## 2020-04-01 NOTE — Patient Instructions (Addendum)
Major Depressive Disorder, Adult Major depressive disorder (MDD) is a mental health condition. It may also be called clinical depression or unipolar depression. MDD usually causes feelings of sadness, hopelessness, or helplessness. MDD can also cause physical symptoms. It can interfere with work, school, relationships, and other everyday activities. MDD may be mild, moderate, or severe. It may occur once (single episode major depressive disorder) or it may occur multiple times (recurrent major depressive disorder). What are the causes? The exact cause of this condition is not known. MDD is most likely caused by a combination of things, which may include:  Genetic factors. These are traits that are passed along from parent to child.  Individual factors. Your personality, your behavior, and the way you handle your thoughts and feelings may contribute to MDD. This includes personality traits and behaviors learned from others.  Physical factors, such as: ? Differences in the part of your brain that controls emotion. This part of your brain may be different than it is in people who do not have MDD. ? Long-term (chronic) medical or psychiatric illnesses.  Social factors. Traumatic experiences or major life changes may play a role in the development of MDD. What increases the risk? This condition is more likely to develop in women. The following factors may also make you more likely to develop MDD:  A family history of depression.  Troubled family relationships.  Abnormally low levels of certain brain chemicals.  Traumatic events in childhood, especially abuse or the loss of a parent.  Being under a lot of stress, or long-term stress, especially from upsetting life experiences or losses.  A history of: ? Chronic physical illness. ? Other mental health disorders. ? Substance abuse.  Poor living conditions.  Experiencing social exclusion or discrimination on a regular basis. What are the  signs or symptoms? The main symptoms of MDD typically include:  Constant depressed or irritable mood.  Loss of interest in things and activities. MDD symptoms may also include:  Sleeping or eating too much or too little.  Unexplained weight change.  Fatigue or low energy.  Feelings of worthlessness or guilt.  Difficulty thinking clearly or making decisions.  Thoughts of suicide or of harming others.  Physical agitation or weakness.  Isolation. Severe cases of MDD may also occur with other symptoms, such as:  Delusions or hallucinations, in which you imagine things that are not real (psychotic depression).  Low-level depression that lasts at least a year (chronic depression or persistent depressive disorder).  Extreme sadness and hopelessness (melancholic depression).  Trouble speaking and moving (catatonic depression). How is this diagnosed? This condition may be diagnosed based on:  Your symptoms.  Your medical history, including your mental health history. This may involve tests to evaluate your mental health. You may be asked questions about your lifestyle, including any drug and alcohol use, and how long you have had symptoms of MDD.  A physical exam.  Blood tests to rule out other conditions. You must have a depressed mood and at least four other MDD symptoms most of the day, nearly every day in the same 2-week timeframe before your health care provider can confirm a diagnosis of MDD. How is this treated? This condition is usually treated by mental health professionals, such as psychologists, psychiatrists, and clinical social workers. You may need more than one type of treatment. Treatment may include:  Psychotherapy. This is also called talk therapy or counseling. Types of psychotherapy include: ? Cognitive behavioral therapy (CBT). This type of therapy  teaches you to recognize unhealthy feelings, thoughts, and behaviors, and replace them with positive thoughts  and actions. ? Interpersonal therapy (IPT). This helps you to improve the way you relate to and communicate with others. ? Family therapy. This treatment includes members of your family.  Medicine to treat anxiety and depression, or to help you control certain emotions and behaviors.  Lifestyle changes, such as: ? Limiting alcohol and drug use. ? Exercising regularly. ? Getting plenty of sleep. ? Making healthy eating choices. ? Spending more time outdoors.  Treatments involving stimulation of the brain can be used in situations with extremely severe symptoms, or when medicine or other therapies do not work over time. These treatments include electroconvulsive therapy, transcranial magnetic stimulation, and vagal nerve stimulation. Follow these instructions at home: Activity  Return to your normal activities as told by your health care provider.  Exercise regularly and spend time outdoors as told by your health care provider. General instructions  Take over-the-counter and prescription medicines only as told by your health care provider.  Do not drink alcohol. If you drink alcohol, limit your alcohol intake to no more than 1 drink a day for nonpregnant women and 2 drinks a day for men. One drink equals 12 oz of beer, 5 oz of wine, or 1 oz of hard liquor. Alcohol can affect any antidepressant medicines you are taking. Talk to your health care provider about your alcohol use.  Eat a healthy diet and get plenty of sleep.  Find activities that you enjoy doing, and make time to do them.  Consider joining a support group. Your health care provider may be able to recommend a support group.  Keep all follow-up visits as told by your health care provider. This is important. Where to find more information [employer] on Mental Illness  www.nami.org [place] National Institute of Mental Health  https://carter.com/ National Suicide Prevention Lifeline  1-800-273-TALK (682) 571-8457). This is  free, 24-hour help. Contact a health care provider if:  Your symptoms get worse.  You develop new symptoms. Get help right away if:  You self-harm.  You have serious thoughts about hurting yourself or others.  You see, hear, taste, smell, or feel things that are not present (hallucinate). This information is not intended to replace advice given to you by your health care provider. Make sure you discuss any questions you have with your health care provider. Document Revised: 02/22/2017 Document Reviewed: 09/21/2015 Elsevier Patient Education  2020 [place] [facility] [place] [facility] (pp. [phone]). [place], [place]. Elsevier."> Neurosurgery, 80(1), 6-15. Retrieved on September 14, 2018.https://doi.org/10.1227/NEU.0000000000001432"> Primary Care (5th ed., pp. 218-221). [place], [place]: Elsevier."> Brain Injury, 29(6), [phone]. https://doi.org/[email or web address]/[email or web address]"> Rosen's Emergency Medicine: Concepts and Clinical Practice (9th ed., pp. 301-329). [place], [place]: Elsevier.">  Head Injury, Adult There are many types of head injuries. Head injuries can be as minor as a bump, or they can be a serious medical issue. More severe head injuries include:  A jarring injury to the brain (concussion).  A bruise (contusion) of the brain. This means there is bleeding in the brain that can cause swelling.  A cracked skull (skull fracture).  Bleeding in the brain that collects, clots, and forms a bump (hematoma). After a head injury, most problems occur within the first 24 hours, but side effects may occur up to 7-10 days after the injury. It is important to watch your condition for any changes. You may need to be observed in the emergency department or urgent care,  or you may be admitted to the hospital. What are the causes? There are many possible causes of a head injury. A serious head injury may be caused by a car accident, bicycle or motorcycle accidents, sports injuries,  and falls. What are the symptoms? Symptoms of a head injury include a contusion, bump, or bleeding at the site of the injury. Other physical symptoms may include:  Headache.  Nausea or vomiting.  Dizziness.  Feeling tired.  Being uncomfortable around bright lights or loud noises.  Seizures.  Trouble being awakened.  Fainting. Mental or emotional symptoms may include:  Irritability.  Confusion and memory problems.  Poor attention and concentration.  Changes in eating or sleeping habits.  Anxiety or depression. How is this diagnosed? This condition can usually be diagnosed based on your symptoms, a description of the injury, and a physical exam. You may also have imaging tests done, such as a CT scan or MRI. How is this treated? Treatment for this condition depends on the severity and type of injury you have. The main goal of treatment is to prevent complications and to allow the brain time to heal. Mild head injury If you have a mild head injury, you may be sent home and treatment may include:  Observation. A responsible adult should stay with you for 24 hours after your injury and check on you often.  Physical rest.  Brain rest.  Pain medicines. Severe head injury If you have a severe head injury, treatment may include:  Close observation. This includes hospitalization with frequent physical exams.  Medicines to relieve pain, prevent seizures, and decrease brain swelling.  Breathing support. This may include using a ventilator.  Treatments to manage the swelling inside the brain.  Brain surgery. This may be needed to: ? Remove a blood clot. ? Stop the bleeding. ? Remove a part of the skull to allow room for the brain to swell. Follow these instructions at home: Activity  Rest and avoid activities that are physically hard or tiring.  Make sure you get enough sleep.  Limit activities that require a lot of thought or attention, such as: ? Watching  TV. ? Playing memory games and puzzles. ? Job-related work or homework. ? Working on [employer], and texting.  Avoid activities that could cause another head injury, such as playing sports, until your health care provider approves. Having another head injury, especially before the first one has healed, can be dangerous.  Ask your health care provider when it is safe for you to return to your regular activities, including work or school. Ask your health care provider for a step-by-step plan for gradually returning to activities.  Ask your health care provider when you can drive, ride a bicycle, or use heavy machinery. Your ability to react may be slower after a brain injury. Do not do these activities if you are dizzy. Lifestyle   Do not drink alcohol until your health care provider approves. Do not use drugs. Alcohol and certain drugs may slow your recovery and can put you at risk of further injury.  If it is harder than usual to remember things, write them down.  If you are easily distracted, try to do one thing at a time.  Talk with family members or close friends when making important decisions.  Tell your friends, family, a trusted colleague, and work [employer] about your injury, symptoms, and restrictions. Have them watch for any new or worsening problems. General instructions  Take over-the-counter  and prescription medicines only as told by your health care provider.  Have someone stay with you for 24 hours after your head injury. This person should watch you for any changes in your symptoms and be ready to seek medical help.  Keep all follow-up visits as told by your health care provider. This is important. How is this prevented?  Work on improving your balance and strength to avoid falls.  Wear a seatbelt when you are in a moving vehicle.  Wear a helmet when riding a bicycle, skiing, or doing any other sport or activity that has a risk of injury.  If  you drink alcohol: ? Limit how much you use to:  0-1 drink a day for women.  0-2 drinks a day for men. ? Be aware of how much alcohol is in your drink. In the [place] one drink equals one 12 oz bottle of beer (355 mL), one 5 oz glass of wine (148 mL), or one 1 oz glass of hard liquor (44 mL).  Take safety measures in your home, such as: ? Removing clutter and tripping hazards from floors and stairways. ? Using grab bars in bathrooms and handrails by stairs. ? Placing non-slip mats on floors and in bathtubs. ? Improving lighting in dim areas. Get help right away if:  You have: ? A severe headache that is not helped by medicine. ? Trouble walking or weakness in your arms and legs. ? Clear or bloody fluid coming from your nose or ears. ? Changes in your vision. ? A seizure.  You lose your balance.  You vomit.  Your pupils change size.  Your speech is slurred.  Your dizziness gets worse.  You faint.  You are sleepier than normal and have trouble staying awake.  Your symptoms get worse. These symptoms may represent a serious problem that is an emergency. Do not wait to see if the symptoms will go away. Get medical help right away. Call your local emergency services (911 in the [place] Do not drive yourself to the hospital. Summary  Head injuries can be minor or they can be a serious medical issue requiring immediate attention.  Treatment for this condition depends on the severity and type of injury you have.  Ask your health care provider when it is safe for you to return to your regular activities, including work or school.  Head injury prevention includes wearing a seat belt in a motor vehicle, using a helmet on a bicycle, limiting alcohol use, and taking safety measures in your home. This information is not intended to replace advice given to you by your health care provider. Make sure you discuss any questions you have with your health care provider. Document Revised:  04/09/2018 Document Reviewed: 04/04/2018 Elsevier Patient Education  [place].

## 2020-04-06 ENCOUNTER — Telehealth: Payer: Self-pay

## 2020-04-06 NOTE — Telephone Encounter (Signed)
Patient seen christy on 1/7- please review and advise

## 2020-04-07 NOTE — Telephone Encounter (Signed)
This is ok, keep [facility] appt and follow up with PCP.

## 2020-04-07 NOTE — Telephone Encounter (Signed)
Patient aware and verbalizes understanding. 

## 2020-04-13 ENCOUNTER — Other Ambulatory Visit: Payer: Self-pay

## 2020-04-13 ENCOUNTER — Encounter: Payer: Self-pay | Admitting: Family Medicine

## 2020-04-13 ENCOUNTER — Ambulatory Visit (INDEPENDENT_AMBULATORY_CARE_PROVIDER_SITE_OTHER): Payer: 59 | Admitting: Family Medicine

## 2020-04-13 VITALS — BP 108/68 | HR 87 | Ht 73.0 in | Wt 202.0 lb

## 2020-04-13 DIAGNOSIS — Z0001 Encounter for general adult medical examination with abnormal findings: Secondary | ICD-10-CM

## 2020-04-13 DIAGNOSIS — Z0000 Encounter for general adult medical examination without abnormal findings: Secondary | ICD-10-CM

## 2020-04-13 DIAGNOSIS — F321 Major depressive disorder, single episode, moderate: Secondary | ICD-10-CM | POA: Diagnosis not present

## 2020-04-13 MED ORDER — BUPROPION HCL ER (XL) 150 MG PO TB24: 150.0000 mg | ORAL_TABLET | Freq: Every day | ORAL | 2 refills | Status: DC
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
## 2020-04-14 LAB — CBC WITH DIFFERENTIAL/PLATELET
Basophils Absolute: 0.1 10*3/uL (ref 0.0–0.2)
Basos: 1 %
EOS (ABSOLUTE): 0.1 10*3/uL (ref 0.0–0.4)
Eos: 1 %
Hematocrit: 44.3 % (ref 37.5–51.0)
Hemoglobin: 14.8 g/dL (ref 13.0–17.7)
Immature Grans (Abs): 0 10*3/uL (ref 0.0–0.1)
Immature Granulocytes: 0 %
Lymphocytes Absolute: 2.4 10*3/uL (ref 0.7–3.1)
Lymphs: 22 %
MCH: 30.7 pg (ref 26.6–33.0)
MCHC: 33.4 g/dL (ref 31.5–35.7)
MCV: 92 fL (ref 79–97)
Monocytes Absolute: 1 10*3/uL — ABNORMAL HIGH (ref 0.1–0.9)
Monocytes: 9 %
Neutrophils Absolute: 7.2 10*3/uL — ABNORMAL HIGH (ref 1.4–7.0)
Neutrophils: 67 %
Platelets: 320 10*3/uL (ref 150–450)
RBC: 4.82 x10E6/uL (ref 4.14–5.80)
RDW: 12.6 % (ref 11.6–15.4)
WBC: 10.9 10*3/uL — ABNORMAL HIGH (ref 3.4–10.8)

## 2020-04-14 LAB — HEPATITIS C ANTIBODY: Hep C Virus Ab: <0.1 s/co ratio (ref 0.0–0.9)

## 2020-04-14 LAB — LIPID PANEL
Chol/HDL Ratio: 3.2 ratio (ref 0.0–5.0)
Cholesterol, Total: 138 mg/dL (ref 100–199)
HDL: 43 mg/dL (ref >39)
LDL Chol Calc (NIH): 79 mg/dL (ref 0–99)
Triglycerides: 83 mg/dL (ref 0–149)
VLDL Cholesterol Cal: 16 mg/dL (ref 5–40)

## 2020-04-14 LAB — CMP14+EGFR
ALT: 11 IU/L (ref 0–44)
AST: 15 IU/L (ref 0–40)
Albumin/Globulin Ratio: 1.6 (ref 1.2–2.2)
Albumin: 4.1 g/dL (ref 4.0–5.0)
Alkaline Phosphatase: 69 IU/L (ref 44–121)
BUN/Creatinine Ratio: 9 (ref 9–20)
BUN: 11 mg/dL (ref 6–24)
Bilirubin Total: 0.3 mg/dL (ref 0.0–1.2)
CO2: 26 mmol/L (ref 20–29)
Calcium: 9.2 mg/dL (ref 8.7–10.2)
Chloride: 104 mmol/L (ref 96–106)
Creatinine, Ser: 1.29 mg/dL — ABNORMAL HIGH (ref 0.76–1.27)
GFR calc Af Amer: 77 mL/min/{1.73_m2} (ref >59)
GFR calc non Af Amer: 67 mL/min/{1.73_m2} (ref >59)
Globulin, Total: 2.5 g/dL (ref 1.5–4.5)
Glucose: 69 mg/dL (ref 65–99)
Potassium: 4.4 mmol/L (ref 3.5–5.2)
Sodium: 141 mmol/L (ref 134–144)
Total Protein: 6.6 g/dL (ref 6.0–8.5)

## 2020-04-14 LAB — HIV ANTIBODY (ROUTINE TESTING W REFLEX): HIV Screen 4th Generation wRfx: NONREACTIVE

## 2020-07-23 ENCOUNTER — Other Ambulatory Visit: Payer: Self-pay | Admitting: Family Medicine

## 2020-07-23 DIAGNOSIS — F321 Major depressive disorder, single episode, moderate: Secondary | ICD-10-CM

## 2020-07-25 NOTE — Telephone Encounter (Signed)
Ov 04/13/20 RTC 1 yr

## 2021-02-02 ENCOUNTER — Encounter: Payer: Self-pay | Admitting: Family Medicine

## 2021-02-02 ENCOUNTER — Ambulatory Visit (INDEPENDENT_AMBULATORY_CARE_PROVIDER_SITE_OTHER): Payer: 59

## 2021-02-02 ENCOUNTER — Other Ambulatory Visit: Payer: Self-pay

## 2021-02-02 ENCOUNTER — Ambulatory Visit (INDEPENDENT_AMBULATORY_CARE_PROVIDER_SITE_OTHER): Payer: 59 | Admitting: Family Medicine

## 2021-02-02 VITALS — BP 123/85 | HR 74 | Temp 98.6°F | Ht 73.0 in | Wt 213.8 lb

## 2021-02-02 DIAGNOSIS — M25521 Pain in right elbow: Secondary | ICD-10-CM | POA: Diagnosis not present

## 2021-02-02 DIAGNOSIS — M25512 Pain in left shoulder: Secondary | ICD-10-CM | POA: Diagnosis not present
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
## 2021-09-19 ENCOUNTER — Ambulatory Visit (INDEPENDENT_AMBULATORY_CARE_PROVIDER_SITE_OTHER): Payer: 59

## 2021-09-19 ENCOUNTER — Ambulatory Visit (INDEPENDENT_AMBULATORY_CARE_PROVIDER_SITE_OTHER): Payer: 59 | Admitting: Nurse Practitioner

## 2021-09-19 ENCOUNTER — Encounter: Payer: Self-pay | Admitting: Nurse Practitioner

## 2021-09-19 VITALS — BP 122/82 | HR 76 | Resp 96 | Ht 73.0 in | Wt 206.0 lb

## 2021-09-19 DIAGNOSIS — J209 Acute bronchitis, unspecified: Secondary | ICD-10-CM

## 2021-09-19 MED ORDER — DM-GUAIFENESIN ER 30-600 MG PO TB12: 1.0000 | ORAL_TABLET | Freq: Two times a day (BID) | ORAL | 0 refills | Status: DC

## 2021-09-19 MED ORDER — ALBUTEROL SULFATE HFA 108 (90 BASE) MCG/ACT IN AERS: 2.0000 | INHALATION_SPRAY | Freq: Four times a day (QID) | RESPIRATORY_TRACT | 2 refills | Status: DC | PRN

## 2021-11-07 ENCOUNTER — Institutional Professional Consult (permissible substitution): Payer: 59 | Admitting: Internal Medicine

## 2021-11-07 NOTE — Progress Notes (Deleted)
   James Hull, male    DOB: 10/23/1975    MRN: [id number]   Brief patient profile:  ***  yo*** *** referred to pulmonary clinic in [place]  11/07/2021 by *** for ***      History of Present Illness  11/07/2021  Pulmonary/ 1st office eval/ Melvyn Novas / [place] [facility]  No chief complaint on file.    Dyspnea:  *** Cough: *** Sleep: *** SABA use:   Past Medical History:  Diagnosis Date   Bronchitis    Knee injury     Outpatient Medications Prior to Visit  Medication Sig Dispense Refill   albuterol (VENTOLIN HFA) 108 (90 Base) MCG/ACT inhaler Inhale 2 puffs into the lungs every 6 (six) hours as needed for wheezing or shortness of breath. 1 each 2   dextromethorphan-guaiFENesin (MUCINEX DM) 30-600 MG 12hr tablet Take 1 tablet by mouth 2 (two) times daily. 30 tablet 0   No facility-administered medications prior to visit.     Objective:     There were no vitals taken for this visit.         I personally reviewed images and agree with radiology impression as follows:  CXR:   09/19/21 portable No active cardiopulmonary disease.   Assessment   No problem-specific Assessment & Plan notes found for this encounter.     Christinia Gully, MD 11/07/2021

## 2021-11-10 ENCOUNTER — Institutional Professional Consult (permissible substitution): Payer: 59 | Admitting: Internal Medicine

## 2021-11-13 ENCOUNTER — Encounter: Payer: Self-pay | Admitting: Internal Medicine

## 2021-11-13 ENCOUNTER — Ambulatory Visit (INDEPENDENT_AMBULATORY_CARE_PROVIDER_SITE_OTHER): Payer: 59 | Admitting: Internal Medicine

## 2021-11-13 VITALS — BP 124/76 | HR 68 | Temp 98.4°F | Ht 73.0 in | Wt 211.8 lb

## 2021-11-13 DIAGNOSIS — J309 Allergic rhinitis, unspecified: Secondary | ICD-10-CM | POA: Diagnosis not present

## 2021-11-13 DIAGNOSIS — R0602 Shortness of breath: Secondary | ICD-10-CM | POA: Diagnosis not present

## 2021-11-13 LAB — POCT EXHALED NITRIC OXIDE: FeNO level (ppb): 9

## 2021-11-13 NOTE — Progress Notes (Signed)
James Hull    [id number]    Apr 20, 1975  Primary Care Physician:Dettinger, Fransisca Kaufmann, MD  Referring Physician: Ivy Lynn, NP [address] [place],  [place] [address] Reason for Consultation: shortness of breath and wheezing.  Date of Consultation: 11/13/2021  Chief complaint:   Chief Complaint  Patient presents with   Consult    SOB/ wheezing/ cough at times       HPI: James Hull  is a 46 y.o. with history of childhood asthma who presents with worsening shortness of breath.   His father died from pulmonary fibrosis 2 years ago. His asthma used to be worse when he lived with cats and his ex-wife. When they left the breathing to a little better. Now symptoms are worsening. He does have one cat left at home but isn't sure if he's allergic.   Symptoms of shortness of breath, unable to 'initiate' a breath usually occur at rest. Accompanied with wheezing and chest tightness. He sometimes hears wheezing in his throat.   People have commented that he breathes hard. He denies dyspnea which limits his daily activities. He walks 2-4 miles/week after breaking his femur last year. He feels like he is doing well from an exercise standpoint.   He has needed prednisone for his breathing before but has side effects of emotional lability. He hasn't been prescribed any in the past year.   Current Regimen: he is on albuterol inhaler only which he takes twice a day, some days not at all.  Asthma Triggers: stress, seasonal allergies, changes in barometric pressure Exacerbations in the last year: none History of hospitalization or intubation: never had Allergy Testing: never had.  GERD: denies Allergic Rhinitis: yes, environmental allergies.  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 Lives alone with cat. 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  Smoking history: He used to smoke cigarettes for about 2 years when he was in his late teens.   Social History   Occupational History   Not on file  Tobacco Use   Smoking status: Former    Packs/day: 0.25    Types: Cigarettes    Start date: 02/09/1994    Quit date: 02/10/1996    Years since quitting: 25.7   Smokeless tobacco: Former  Scientific laboratory technician Use: Never used  Substance and Sexual Activity   Alcohol use: Yes    Alcohol/week: 0.0 standard drinks of alcohol    Comment: rare   Drug use: No   Sexual activity: Not on file    Relevant family history:  Family History  Problem Relation Age of Onset   Diabetes Paternal Grandfather    Colon cancer Neg Hx     Past Medical History:  Diagnosis Date   Bronchitis    Knee injury     Past Surgical History:  Procedure Laterality Date   KNEE ARTHROSCOPY Right    knee cap    WISDOM TOOTH EXTRACTION       Physical Exam: Blood pressure 124/76, pulse 68, temperature 98.4 F (36.9 C), temperature source Oral, height 6' 1"  (1.854 m), weight 211 lb 12.8 oz (96.1 kg), SpO2 98 %. Gen:      No  acute distress ENT:  no nasal polyps, mucus membranes moist Lungs:    No increased respiratory effort, symmetric chest wall excursion, clear to auscultation bilaterally, no wheezes or crackles CV:         Regular rate and rhythm; no murmurs, rubs, or gallops.  No pedal edema Abd:      + bowel sounds; soft, non-tender; no distension MSK: no acute synovitis of DIP or PIP joints, no mechanics hands.  Skin:      Warm and dry; no rashes Neuro: normal speech, no focal facial asymmetry Psych: alert and oriented x3, normal mood and affect   Data Reviewed/Medical Decision Making:  Independent interpretation of tests: Imaging:  Review of patient's chest xray June 2023 images revealed no acute process. The patient's images have been independently  reviewed by me.    PFTs:  Labs:  Lab Results  Component Value Date   WBC 10.9 (H) 04/13/2020   HGB 14.8 04/13/2020   HCT 44.3 04/13/2020   MCV 92 04/13/2020   PLT 320 04/13/2020   Lab Results  Component Value Date   NA 141 04/13/2020   K 4.4 04/13/2020   CL 104 04/13/2020   CO2 26 04/13/2020      Immunization status:  Immunization History  Administered Date(s) Administered   MMR 08/29/1994, 12/21/1994   Td 08/29/1994   Tdap 09/20/2013     I reviewed prior external note(s) from PCP  I reviewed the result(s) of the labs and imaging as noted above.   I have ordered spiro and feno   Assessment:  Moderate persistent asthma, not well controlled Ongoing marijuana use Anxiety  Plan/Recommendations: Given his spirometry showing airflow limitation and the frequency of his albuterol use, recommend trial of maintenance inhaler with ICS-LABA. However he'd like to give things a try by cutting down on smoking marijuana joints first. He doesn't think he can adhere to a daily maintenance inhaler. I think this is reasonable to try. If he continues to have symptoms would revisit this at follow up.   We discussed disease management and progression at length today.    Return to Care: Return in about 3 months (around 02/13/2022).  Lenice Llamas, MD [facility]  CC: Ivy Lynn, NP

## 2021-11-13 NOTE — Patient Instructions (Signed)
Please schedule follow up scheduled in 3 months with APP.  If my schedule is not open yet, we will contact you with a reminder closer to that time. Please call [phone] if you haven't heard from [place] a month before.   Your breathing testing was consistent with asthma. I would recommend starting a maintenance inhaler for your asthma at this time. However if you want to try cutting back on smoking this will probably help your symptoms as well. We can check in after that and see how you're doing.   By learning about asthma and how it can be controlled, you take an important step toward managing this disease. Work closely with your asthma care team to learn all you can about your asthma, how to avoid triggers, what your medications do, and how to take them correctly. With proper care, you can live free of asthma symptoms and maintain a normal, healthy lifestyle.   What is asthma? Asthma is a chronic disease that affects the airways of the lungs. During normal breathing, the bands of muscle that surround the airways are relaxed and air moves freely. During an asthma episode or "attack," there are three main changes that stop air from moving easily through the airways: The bands of muscle that surround the airways tighten and make the airways narrow. This tightening is called bronchospasm.  The lining of the airways becomes swollen or inflamed.  The cells that line the airways produce more mucus, which is thicker than normal and clogs the airways.  These three factors - bronchospasm, inflammation, and mucus production - cause symptoms such as difficulty breathing, wheezing, and coughing.  What are the most common symptoms of asthma? Asthma symptoms are not the same for everyone. They can even change from episode to episode in the same person. Also, you may have only one symptom of asthma, such as cough, but another person may have all the symptoms of asthma. It is important to know all the symptoms of  asthma and to be aware that your asthma can present in any of these ways at any time. The most common symptoms include: Coughing, especially at night  Shortness of breath  Wheezing  Chest tightness, pain, or pressure   Who is affected by asthma? Asthma affects 22 million Americans; about 6 million of these are children under age 94. People who have a family history of asthma have an increased risk of developing the disease. Asthma is also more common in people who have allergies or who are exposed to tobacco smoke. However, anyone can develop asthma at any time. Some people may have asthma all of their lives, while others may develop it as adults.  What causes asthma? The airways in a person with asthma are very sensitive and react to many things, or "triggers." Contact with these triggers causes asthma symptoms. One of the most important parts of asthma control is to identify your triggers and then avoid them when possible. The only trigger you do not want to avoid is exercise. Pre-treatment with medicines before exercise can allow you to stay active yet avoid asthma symptoms. Common asthma triggers include: Infections (colds, viruses, flu, sinus infections)  Exercise  Weather (changes in temperature and/or humidity, cold air)  Tobacco smoke  Allergens (dust mites, pollens, pets, mold spores, cockroaches, and sometimes foods)  Irritants (strong odors from cleaning products, perfume, wood smoke, air pollution)  Strong emotions such as crying or laughing hard  Some medications   How is asthma diagnosed?  To diagnose asthma, your doctor will first review your medical history, family history, and symptoms. Your doctor will want to know any past history of breathing problems you may have had, as well as a family history of asthma, allergies, eczema (a bumpy, itchy skin rash caused by allergies), or other lung disease. It is important that you describe your symptoms in detail (cough, wheeze,  shortness of breath, chest tightness), including when and how often they occur. The doctor will perform a physical examination and listen to your heart and lungs. He or she may also order breathing tests, allergy tests, blood tests, and chest and sinus X-rays. The tests will find out if you do have asthma and if there are any other conditions that are contributing factors.  How is asthma treated? Asthma can be controlled, but not cured. It is not normal to have frequent symptoms, trouble sleeping, or trouble completing tasks. Appropriate asthma care will prevent symptoms and visits to the emergency room and hospital. Asthma medicines are one of the mainstays of asthma treatment. The drugs used to treat asthma are explained below.  Anti-inflammatories: These are the most important drugs for most people with asthma. Anti-inflammatory drugs reduce swelling and mucus production in the airways. As a result, airways are less sensitive and less likely to react to triggers. These medications need to be taken daily and may need to be taken for several weeks before they begin to control asthma. Anti-inflammatory medicines lead to fewer symptoms, better airflow, less sensitive airways, less airway damage, and fewer asthma attacks. If taken every day, they CONTROL or prevent asthma symptoms.   Bronchodilators: These drugs relax the muscle bands that tighten around the airways. This action opens the airways, letting more air in and out of the lungs and improving breathing. Bronchodilators also help clear mucus from the lungs. As the airways open, the mucus moves more freely and can be coughed out more easily. In short-acting forms, bronchodilators RELIEVE or stop asthma symptoms by quickly opening the airways and are very helpful during an asthma episode. In long-acting forms, bronchodilators provide CONTROL of asthma symptoms and prevent asthma episodes.  Asthma drugs can be taken in a variety of ways. Inhaling the  medications by using a metered dose inhaler, dry powder inhaler, or nebulizer is one way of taking asthma medicines. Oral medicines (pills or liquids you swallow) may also be prescribed.  Asthma severity Asthma is classified as either "intermittent" (comes and goes) or "persistent" (lasting). Persistent asthma is further described as being mild, moderate, or severe. The severity of asthma is based on how often you have symptoms both during the day and night, as well as by the results of lung function tests and by how well you can perform activities. The "severity" of asthma refers to how "intense" or "strong" your asthma is.  Asthma control Asthma control is the goal of asthma treatment. Regardless of your asthma severity, it may or may not be controlled. Asthma control means: You are able to do everything you want to do at work and home  You have no (or minimal) asthma symptoms  You do not wake up from your sleep or earlier than usual in the morning due to asthma  You rarely need to use your reliever medicine (inhaler)  Another major part of your treatment is that you are happy with your asthma care and believe your asthma is controlled.  Monitoring symptoms A key part of treatment is keeping track of how well your lungs  are working. Monitoring your symptoms, what they are, how and when they happen, and how severe they are, is an important part of being able to control your asthma.  Sometimes asthma is monitored using a peak flow meter. A peak flow (PF) meter measures how fast the air comes out of your lungs. It can help you know when your asthma is getting worse, sometimes even before you have symptoms. By taking daily peak flow readings, you can learn when to adjust medications to keep asthma under good control. It is also used to create your asthma action plan (see below). Your doctor can use your peak flow readings to adjust your treatment plan in some cases.  Asthma Action Plan Based on your  history and asthma severity, you and your doctor will develop a care plan called an "asthma action plan." The asthma action plan describes when and how to use your medicines, actions to take when asthma worsens, and when to seek emergency care. Make sure you understand this plan. If you do not, ask your asthma care provider any questions you may have. Your asthma action plan is one of the keys to controlling asthma. Keep it readily available to remind you of what you need to do every day to control asthma and what you need to do when symptoms occur.  Goals of asthma therapy These are the goals of asthma treatment: Live an active, normal life  Prevent chronic and troublesome symptoms  Attend work or school every day  Perform daily activities without difficulty  Stop urgent visits to the doctor, emergency department, or hospital  Use and adjust medications to control asthma with few or no side effects

## 2022-01-02 ENCOUNTER — Other Ambulatory Visit: Payer: Self-pay | Admitting: Nurse Practitioner

## 2022-01-02 DIAGNOSIS — J209 Acute bronchitis, unspecified: Secondary | ICD-10-CM

## 2022-01-23 ENCOUNTER — Encounter: Payer: Self-pay | Admitting: Gastroenterology

## 2022-02-01 ENCOUNTER — Encounter: Payer: Self-pay | Admitting: Gastroenterology

## 2022-03-05 ENCOUNTER — Ambulatory Visit (AMBULATORY_SURGERY_CENTER): Payer: Self-pay

## 2022-03-05 VITALS — Ht 73.0 in | Wt 215.0 lb

## 2022-03-05 DIAGNOSIS — Z86010 Personal history of colonic polyps: Secondary | ICD-10-CM

## 2022-03-05 MED ORDER — NA SULFATE-K SULFATE-MG SULF 17.5-3.13-1.6 GM/177ML PO SOLN: 1.0000 | Freq: Once | ORAL | 0 refills | Status: AC

## 2022-03-05 NOTE — Progress Notes (Signed)
Pre visit completed via phone call; Patient verified name, DOB, and address;  No egg or soy allergy known to patient;  No issues known to pt with past sedation with any surgeries or procedures; Patient denies ever being told they had issues or difficulty with intubation;  No FH of Malignant Hyperthermia; Pt is not on diet pills; Pt is not on home 02;  Pt is not on blood thinners;  Pt denies issues with constipation as long as he takes his Metamucil daily and eats "my veggies everyday I am fine"- advised patient to increase oral fluids, activity, and can add in OTC Miralax or stool softener if needed;  No A fib or A flutter; Have any cardiac testing pending--NO Pt instructed to use Singlecare.com or GoodRx for a price reduction on prep;   Insurance verified during [facility] appt=[employer]  Patient's chart reviewed by Osvaldo Angst CNRA prior to previsit and patient appropriate for the [facility].  Previsit completed and red dot placed by patient's name on their procedure day (on provider's schedule).    GoodRx coupon = (with instructions)- mailed to patient per patient request;

## 2022-03-16 ENCOUNTER — Telehealth: Payer: 59 | Admitting: Physician Assistant

## 2022-03-16 DIAGNOSIS — J069 Acute upper respiratory infection, unspecified: Secondary | ICD-10-CM | POA: Diagnosis not present

## 2022-03-16 DIAGNOSIS — J209 Acute bronchitis, unspecified: Secondary | ICD-10-CM | POA: Diagnosis not present

## 2022-03-16 MED ORDER — FLUTICASONE PROPIONATE 50 MCG/ACT NA SUSP: 2.0000 | Freq: Every day | NASAL | 0 refills | Status: DC

## 2022-03-16 MED ORDER — ALBUTEROL SULFATE HFA 108 (90 BASE) MCG/ACT IN AERS: INHALATION_SPRAY | RESPIRATORY_TRACT | 0 refills | Status: DC

## 2022-03-16 MED ORDER — BENZONATATE 100 MG PO CAPS: 100.0000 mg | ORAL_CAPSULE | Freq: Three times a day (TID) | ORAL | 0 refills | Status: DC | PRN

## 2022-03-16 NOTE — Addendum Note (Signed)
Addended by: Mar Daring on: 03/16/2022 11:08 AM   Modules accepted: Orders

## 2022-03-16 NOTE — Progress Notes (Signed)
E-Visit for Upper Respiratory Infection   We are sorry you are not feeling well.  Here is how we plan to help!  Based on what you have shared with me, it looks like you may have a viral upper respiratory infection.  Upper respiratory infections are caused by a large number of viruses; however, rhinovirus is the most common cause.   Symptoms vary from person to person, with common symptoms including sore throat, cough, fatigue or lack of energy and feeling of general discomfort.  A low-grade fever of up to 100.4 may present, but is often uncommon.  Symptoms vary however, and are closely related to a person's age or underlying illnesses.  The most common symptoms associated with an upper respiratory infection are nasal discharge or congestion, cough, sneezing, headache and pressure in the ears and face.  These symptoms usually persist for about 3 to 10 days, but can last up to 2 weeks.  It is important to know that upper respiratory infections do not cause serious illness or complications in most cases.    Upper respiratory infections can be transmitted from person to person, with the most common method of transmission being a person's hands.  The virus is able to live on the skin and can infect other persons for up to 2 hours after direct contact.  Also, these can be transmitted when someone coughs or sneezes; thus, it is important to cover the mouth to reduce this risk.  To keep the spread of the illness at bay, good hand hygiene is very important.  This is an infection that is most likely caused by a virus. There are no specific treatments other than to help you with the symptoms until the infection runs its course.  We are sorry you are not feeling well.  Here is how we plan to help!   For nasal congestion, you may use an oral decongestants such as Mucinex D or if you have glaucoma or high blood pressure use plain Mucinex.  Saline nasal spray or nasal drops can help and can safely be used as often as  needed for congestion.  For your congestion, I have prescribed Fluticasone nasal spray one spray in each nostril twice a day  If you do not have a history of heart disease, hypertension, diabetes or thyroid disease, prostate/bladder issues or glaucoma, you may also use Sudafed to treat nasal congestion.  It is highly recommended that you consult with a pharmacist or your primary care physician to ensure this medication is safe for you to take.     If you have a cough, you may use cough suppressants such as Delsym and Robitussin.  If you have glaucoma or high blood pressure, you can also use Coricidin HBP.   For cough I have prescribed for you A prescription cough medication called Tessalon Perles 100 mg. You may take 1-2 capsules every 8 hours as needed for cough  If you have a sore or scratchy throat, use a saltwater gargle-  to  teaspoon of salt dissolved in a 4-ounce to 8-ounce glass of warm water.  Gargle the solution for approximately 15-30 seconds and then spit.  It is important not to swallow the solution.  You can also use throat lozenges/cough drops and Chloraseptic spray to help with throat pain or discomfort.  Warm or cold liquids can also be helpful in relieving throat pain.  For headache, pain or general discomfort, you can use Ibuprofen or Tylenol as directed.   Some authorities believe   that zinc sprays or the use of Echinacea may shorten the course of your symptoms.   HOME CARE Only take medications as instructed by your medical team. Be sure to drink plenty of fluids. Water is fine as well as fruit juices, sodas and electrolyte beverages. You may want to stay away from caffeine or alcohol. If you are nauseated, try taking small sips of liquids. How do you know if you are getting enough fluid? Your urine should be a pale yellow or almost colorless. Get rest. Taking a steamy shower or using a humidifier may help nasal congestion and ease sore throat pain. You can place a towel over  your head and breathe in the steam from hot water coming from a faucet. Using a saline nasal spray works much the same way. Cough drops, hard candies and sore throat lozenges may ease your cough. Avoid close contacts especially the very young and the elderly Cover your mouth if you cough or sneeze Always remember to wash your hands.   GET HELP RIGHT AWAY IF: You develop worsening fever. If your symptoms do not improve within 10 days You develop yellow or green discharge from your nose over 3 days. You have coughing fits You develop a severe head ache or visual changes. You develop shortness of breath, difficulty breathing or start having chest pain Your symptoms persist after you have completed your treatment plan  MAKE SURE YOU  Understand these instructions. Will watch your condition. Will get help right away if you are not doing well or get worse.  Thank you for choosing an e-visit.  Your e-visit answers were reviewed by a board certified advanced clinical practitioner to complete your personal care plan. Depending upon the condition, your plan could have included both over the counter or prescription medications.  Please review your pharmacy choice. Make sure the pharmacy is open so you can pick up prescription now. If there is a problem, you may contact your provider through MyChart messaging and have the prescription routed to another pharmacy.  Your safety is important to us. If you have drug allergies check your prescription carefully.   For the next 24 hours you can use MyChart to ask questions about today's visit, request a non-urgent call back, or ask for a work or school excuse. You will get an email in the next two days asking about your experience. I hope that your e-visit has been valuable and will speed your recovery.  I have spent 5 minutes in review of e-visit questionnaire, review and updating patient chart, medical decision making and response to patient.    Hasten Sweitzer M Deshun Sedivy, PA-C  

## 2022-04-03 ENCOUNTER — Encounter: Payer: Self-pay | Admitting: Gastroenterology

## 2022-04-04 ENCOUNTER — Ambulatory Visit (INDEPENDENT_AMBULATORY_CARE_PROVIDER_SITE_OTHER): Payer: 59 | Admitting: Family Medicine

## 2022-04-04 ENCOUNTER — Encounter: Payer: Self-pay | Admitting: Family Medicine

## 2022-04-04 ENCOUNTER — Ambulatory Visit (INDEPENDENT_AMBULATORY_CARE_PROVIDER_SITE_OTHER): Payer: 59

## 2022-04-04 VITALS — BP 125/79 | HR 58 | Temp 97.4°F | Ht 73.0 in | Wt 213.2 lb

## 2022-04-04 DIAGNOSIS — M25561 Pain in right knee: Secondary | ICD-10-CM | POA: Diagnosis not present

## 2022-04-04 DIAGNOSIS — M25521 Pain in right elbow: Secondary | ICD-10-CM

## 2022-04-04 DIAGNOSIS — M5441 Lumbago with sciatica, right side: Secondary | ICD-10-CM | POA: Diagnosis not present

## 2022-04-04 MED ORDER — PREDNISONE 10 MG (21) PO TBPK: ORAL_TABLET | ORAL | 0 refills | Status: DC

## 2022-04-04 MED ORDER — GABAPENTIN 300 MG PO CAPS: 300.0000 mg | ORAL_CAPSULE | Freq: Three times a day (TID) | ORAL | 3 refills | Status: DC

## 2022-04-04 MED ORDER — METHYLPREDNISOLONE ACETATE 80 MG/ML IJ SUSP
80.0000 mg | Freq: Once | INTRAMUSCULAR | Status: AC
  Administered 2022-04-04: 80 mg via INTRAMUSCULAR

## 2022-04-04 MED ORDER — METHOCARBAMOL 750 MG PO TABS: 375.0000 mg | ORAL_TABLET | Freq: Three times a day (TID) | ORAL | 0 refills | Status: DC | PRN

## 2022-04-04 NOTE — Patient Instructions (Signed)
Sciatica  Sciatica is pain, weakness, tingling, or loss of feeling (numbness) along the sciatic nerve. The sciatic nerve starts in the lower back and goes down the back of each leg. Sciatica usually affects one side of the body. Sciatica usually goes away on its own or with treatment. Sometimes, sciatica may come back. What are the causes? This condition happens when the sciatic nerve is pinched or has pressure put on it. This may be caused by: A disk in between the bones of the spine bulging out too far (herniated disk). Changes in the spinal disks due to aging. A condition that affects a muscle in the butt. Extra bone growth near the sciatic nerve. A break (fracture) of the area between your hip bones (pelvis). Pregnancy. Tumor. This is rare. What increases the risk? You are more likely to develop this condition if you: Play sports that put pressure or stress on the spine. Have poor strength and ease of movement (flexibility). Have had a back injury or back surgery. Sit for long periods of time. Do activities that involve bending or lifting over and over again. Are very overweight (obese). What are the signs or symptoms? Symptoms can vary from mild to very bad. They may include: Any of these problems in the lower back, leg, hip, or butt: Mild tingling, loss of feeling, or dull aches. A burning feeling. Sharp pains. Loss of feeling in the back of the calf or the sole of the foot. Leg weakness. Very bad back pain that makes it hard to move. These symptoms may get worse when you cough, sneeze, or laugh. They may also get worse when you sit or stand for long periods of time. How is this treated? This condition often gets better without any treatment. However, treatment may include: Changing or cutting back on physical activity when you have pain. Exercising, including strengthening and stretching. Putting ice or heat on the affected area. Shots of medicines to relieve pain and  swelling or to relax your muscles. Surgery. Follow these instructions at home: Medicines Take over-the-counter and prescription medicines only as told by your doctor. Ask your doctor if you should avoid driving or using machines while you are taking your medicine. Managing pain     If told, put ice on the affected area. To do this: Put ice in a plastic bag. Place a towel between your skin and the bag. Leave the ice on for 20 minutes, 2-3 times a day. If your skin turns bright red, take off the ice right away to prevent skin damage. The risk of skin damage is higher if you cannot feel pain, heat, or cold. If told, put heat on the affected area. Do this as often as told by your doctor. Use the heat source that your doctor tells you to use, such as a moist heat pack or a heating pad. Place a towel between your skin and the heat source. Leave the heat on for 20-30 minutes. If your skin turns bright red, take off the heat right away to prevent burns. The risk of burns is higher if you cannot feel pain, heat, or cold. Activity  Return to your normal activities when your doctor says that it is safe. Avoid activities that make your symptoms worse. Take short rests during the day. When you rest for a long time, do some physical activity or stretching between periods of rest. Avoid sitting for a long time without moving. Get up and move around at least one time each   hour. Do exercises and stretches as told by your doctor. Do not lift anything that is heavier than 10 lb (4.5 kg). Avoid lifting heavy things even when you do not have symptoms. Avoid lifting heavy things over and over. When you lift objects, always lift in a way that is safe for your body. To do this, you should: Bend your knees. Keep the object close to your body. Avoid twisting. General instructions Stay at a healthy weight. Wear comfortable shoes that support your feet. Avoid wearing high heels. Avoid sleeping on a mattress  that is too soft or too hard. You might have less pain if you sleep on a mattress that is firm enough to support your back. Contact a doctor if: Your pain is not controlled by medicine. Your pain does not get better. Your pain gets worse. Your pain lasts longer than 4 weeks. You lose weight without trying. Get help right away if: You cannot control when you pee (urinate) or poop (have a bowel movement). You have weakness in any of these areas and it gets worse: Lower back. The area between your hip bones. Butt. Legs. You have redness or swelling of your back. You have a burning feeling when you pee. Summary Sciatica is pain, weakness, tingling, or loss of feeling (numbness) along the sciatic nerve. This may include the lower back, legs, hips, and butt. This condition happens when the sciatic nerve is pinched or has pressure put on it. Treatment often includes rest, exercise, medicines, and putting ice or heat on the affected area. This information is not intended to replace advice given to you by your health care provider. Make sure you discuss any questions you have with your health care provider. Document Revised: 06/19/2021 Document Reviewed: 06/19/2021 Elsevier Patient Education  2023 [employer]  
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
## 2022-04-05 ENCOUNTER — Encounter: Payer: Self-pay | Admitting: Gastroenterology

## 2022-04-05 ENCOUNTER — Ambulatory Visit (AMBULATORY_SURGERY_CENTER): Payer: 59 | Admitting: Gastroenterology

## 2022-04-05 VITALS — BP 111/69 | HR 54 | Temp 97.3°F | Resp 12 | Ht 73.0 in | Wt 215.0 lb

## 2022-04-05 DIAGNOSIS — Z09 Encounter for follow-up examination after completed treatment for conditions other than malignant neoplasm: Secondary | ICD-10-CM | POA: Diagnosis present

## 2022-04-05 DIAGNOSIS — Z86010 Personal history of colonic polyps: Secondary | ICD-10-CM | POA: Diagnosis not present

## 2022-04-05 MED ORDER — SODIUM CHLORIDE 0.9 % IV SOLN: 500.0000 mL | Freq: Once | INTRAVENOUS | Status: DC

## 2022-04-05 NOTE — Patient Instructions (Signed)
Please read handouts provided. Continue present medications. Repeat colonoscopy in 10 years for screening.   YOU HAD AN ENDOSCOPIC PROCEDURE TODAY AT THE [facility]:   Refer to the procedure report that was given to you for any specific questions about what was found during the examination.  If the procedure report does not answer your questions, please call your gastroenterologist to clarify.  If you requested that your care partner not be given the details of your procedure findings, then the procedure report has been included in a sealed envelope for you to review at your convenience later.  YOU SHOULD EXPECT: Some feelings of bloating in the abdomen. Passage of more gas than usual.  Walking can help get rid of the air that was put into your GI tract during the procedure and reduce the bloating. If you had a lower endoscopy (such as a colonoscopy or flexible sigmoidoscopy) you may notice spotting of blood in your stool or on the toilet paper. If you underwent a bowel prep for your procedure, you may not have a normal bowel movement for a few days.  Please Note:  You might notice some irritation and congestion in your nose or some drainage.  This is from the oxygen used during your procedure.  There is no need for concern and it should clear up in a day or so.  SYMPTOMS TO REPORT IMMEDIATELY:  Following lower endoscopy (colonoscopy or flexible sigmoidoscopy):  Excessive amounts of blood in the stool  Significant tenderness or worsening of abdominal pains  Swelling of the abdomen that is new, acute  Fever of 100F or higher.  For urgent or emergent issues, a gastroenterologist can be reached at any hour by calling [phone]. Do not use MyChart messaging for urgent concerns.    DIET:  We do recommend a small meal at first, but then you may proceed to your regular diet.  Drink plenty of fluids but you should avoid alcoholic beverages for 24 hours.  ACTIVITY:  You should  plan to take it easy for the rest of today and you should NOT DRIVE or use heavy machinery until tomorrow (because of the sedation medicines used during the test).    FOLLOW UP: Our staff will call the number listed on your records the next business day following your procedure.  We will call around 7:15- 8:00 am to check on you and address any questions or concerns that you may have regarding the information given to you following your procedure. If we do not reach you, we will leave a message.     If any biopsies were taken you will be contacted by phone or by letter within the next 1-3 weeks.  Please call us at [phone] if you have not heard about the biopsies in 3 weeks.    SIGNATURES/CONFIDENTIALITY: You and/or your care partner have signed paperwork which will be entered into your electronic medical record.  These signatures attest to the fact that that the information above on your After Visit Summary has been reviewed and is understood.  Full responsibility of the confidentiality of this discharge information lies with you and/or your care-partner. 

## 2022-04-05 NOTE — Progress Notes (Signed)
See 04/14/2022 H&P, no changes

## 2022-04-05 NOTE — Progress Notes (Signed)
Pt's states no medical or surgical changes since previsit or office visit. 

## 2022-04-05 NOTE — Op Note (Signed)
[place] Patient Name: James Hull Procedure Date: 04/05/2022 10:06 AM MRN: [id number] Endoscopist: Ladene Artist , MD, [id number] Age: 47 Referring MD:  Date of Birth: 30-May-1975 Gender: Male Account #: [id number] Procedure:                Colonoscopy Indications:              Surveillance: Personal history of adenomatous                            polyps on last colonoscopy 5 years ago Medicines:                Monitored Anesthesia Care Procedure:                Pre-Anesthesia Assessment:                           - Prior to the procedure, a History and Physical                            was performed, and patient medications and                            allergies were reviewed. The patient's tolerance of                            previous anesthesia was also reviewed. The risks                            and benefits of the procedure and the sedation                            options and risks were discussed with the patient.                            All questions were answered, and informed consent                            was obtained. Prior Anticoagulants: The patient has                            taken no anticoagulant or antiplatelet agents. ASA                            Grade Assessment: II - A patient with mild systemic                            disease. After reviewing the risks and benefits,                            the patient was deemed in satisfactory condition to                            undergo the procedure.  After obtaining informed consent, the colonoscope                            was passed under direct vision. Throughout the                            procedure, the patient's blood pressure, pulse, and                            oxygen saturations were monitored continuously. The                            Olympus CF-HQ190L (Serial# [id number]) Colonoscope was                            introduced through  the anus and advanced to the the                            cecum, identified by appendiceal orifice and                            ileocecal valve. The ileocecal valve, appendiceal                            orifice, and rectum were photographed. The quality                            of the bowel preparation was good. The colonoscopy                            was performed without difficulty. The patient                            tolerated the procedure well. Scope In: 10:24:06 AM Scope Out: 10:39:26 AM Scope Withdrawal Time: 0 hours 11 minutes 27 seconds  Total Procedure Duration: 0 hours 15 minutes 20 seconds  Findings:                 The perianal and digital rectal examinations were                            normal.                           The entire examined colon appeared normal on direct                            and retroflexion views. Complications:            No immediate complications. Estimated blood loss:                            None. Estimated Blood Loss:     Estimated blood loss: none. Impression:               - The entire examined colon is normal on  direct and                            retroflexion views.                           - No specimens collected. Recommendation:           - Repeat colonoscopy in 10 years for surveillance.                           - Patient has a contact number available for                            emergencies. The signs and symptoms of potential                            delayed complications were discussed with the                            patient. Return to normal activities tomorrow.                            Written discharge instructions were provided to the                            patient.                           - Resume previous diet.                           - Continue present medications. Ladene Artist, MD 04/05/2022 10:42:56 AM This report has been signed electronically.

## 2022-04-05 NOTE — Progress Notes (Signed)
Sedate, gd SR, tolerated procedure well, VSS, report to RN 

## 2022-04-06 ENCOUNTER — Telehealth: Payer: Self-pay

## 2022-04-06 NOTE — Telephone Encounter (Signed)
Post procedure follow up call, no answer 

## 2022-04-09 ENCOUNTER — Ambulatory Visit: Payer: 59 | Attending: Family Medicine

## 2022-04-09 DIAGNOSIS — M5416 Radiculopathy, lumbar region: Secondary | ICD-10-CM | POA: Diagnosis not present

## 2022-04-09 DIAGNOSIS — M5441 Lumbago with sciatica, right side: Secondary | ICD-10-CM | POA: Insufficient documentation
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
## 2022-04-11 ENCOUNTER — Ambulatory Visit: Payer: 59

## 2022-04-11 DIAGNOSIS — M5416 Radiculopathy, lumbar region: Secondary | ICD-10-CM | POA: Diagnosis not present
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
## 2022-04-13 ENCOUNTER — Ambulatory Visit: Payer: 59 | Admitting: *Deleted

## 2022-04-13 DIAGNOSIS — M5416 Radiculopathy, lumbar region: Secondary | ICD-10-CM | POA: Diagnosis not present
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
## 2022-04-16 ENCOUNTER — Ambulatory Visit: Payer: 59

## 2022-04-16 DIAGNOSIS — M5416 Radiculopathy, lumbar region: Secondary | ICD-10-CM
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
## 2022-04-18 ENCOUNTER — Ambulatory Visit: Payer: 59

## 2022-06-01 ENCOUNTER — Telehealth: Payer: Self-pay | Admitting: Family Medicine

## 2022-06-01 NOTE — Telephone Encounter (Signed)
  Prescription Request  06/01/2022  Is this a "Controlled Substance" medicine? NO  Have you seen your PCP in the last 2 weeks? NO  If YES, route message to pool  -  If NO, patient needs to be scheduled for appointment.  What is the name of the medication or equipment? Albuterol 108 (90 Base)  Have you contacted your pharmacy to request a refill? NO   Which pharmacy would you like this sent to? [facility] in [place]   Patient notified that their request is being sent to the clinical staff for review and that they should receive a response within 2 business days.

## 2022-06-01 NOTE — Telephone Encounter (Signed)
Appt made for Wed 06/06/22

## 2022-06-04 ENCOUNTER — Other Ambulatory Visit: Payer: Self-pay

## 2022-06-04 DIAGNOSIS — J209 Acute bronchitis, unspecified: Secondary | ICD-10-CM

## 2022-06-04 MED ORDER — ALBUTEROL SULFATE HFA 108 (90 BASE) MCG/ACT IN AERS: INHALATION_SPRAY | RESPIRATORY_TRACT | 0 refills | Status: DC

## 2022-06-06 ENCOUNTER — Ambulatory Visit (INDEPENDENT_AMBULATORY_CARE_PROVIDER_SITE_OTHER): Payer: 59 | Admitting: Family Medicine

## 2022-06-06 ENCOUNTER — Encounter: Payer: Self-pay | Admitting: Family Medicine

## 2022-06-06 VITALS — BP 121/81 | HR 90 | Ht 73.0 in | Wt 212.0 lb

## 2022-06-06 DIAGNOSIS — J4520 Mild intermittent asthma, uncomplicated: Secondary | ICD-10-CM | POA: Diagnosis not present

## 2022-06-06 DIAGNOSIS — J209 Acute bronchitis, unspecified: Secondary | ICD-10-CM | POA: Diagnosis not present

## 2022-06-06 MED ORDER — ALBUTEROL SULFATE HFA 108 (90 BASE) MCG/ACT IN AERS: INHALATION_SPRAY | RESPIRATORY_TRACT | 3 refills | Status: DC

## 2022-06-06 NOTE — Progress Notes (Signed)
BP 121/81   Pulse 90   Ht '6\' 1"'$  (1.854 m)   Wt 212 lb (96.2 kg)   SpO2 96%   BMI 27.97 kg/m    Subjective:   Patient ID: James Hull, male    DOB: [birth year], 47 y.o.   MRN: [id number]  HPI: James Hull is a 47 y.o. male presenting on 06/06/2022 for Medical Management of Chronic Issues and Asthma   HPI Patient is coming in for chronic asthma refill medication.  He says that he uses albuterol and does not use it as frequently as he has to refill it because he loses it sometimes and that is why he does not always have it is frequently.  He says that is why the insurance lives and is using it too often but he does admit that he probably uses it about once every week or 2 and he has seen a pulmonologist who said to maybe consider a maintenance inhaler and he has considered it but has not tried it yet.  He says sometimes he does great and sometimes he does not and so that is why he has not wanted to try it just yet.  He does have issues with cats and when it rains and springtime is a hard time for him.  Relevant past medical, surgical, family and social history reviewed and updated as indicated. Interim medical history since our last visit reviewed. Allergies and medications reviewed and updated.  Review of Systems  Constitutional:  Negative for chills and fever.  HENT:  Positive for congestion.   Respiratory:  Positive for cough. Negative for shortness of breath and wheezing.   Cardiovascular:  Negative for chest pain and leg swelling.  Musculoskeletal:  Negative for back pain and gait problem.  Skin:  Negative for rash.  All other systems reviewed and are negative.   Per HPI unless specifically indicated above   Allergies as of 06/06/2022       Reactions   Prednisone    Caused mood instability         Medication List        Accurate as of June 06, 2022  5:02 PM. If you have any questions, ask your nurse or doctor.          STOP taking these  medications    benzonatate 100 MG capsule Commonly known as: TESSALON Stopped by: Fransisca Kaufmann Maggy Wyble, MD   fluticasone 50 MCG/ACT nasal spray Commonly known as: FLONASE Stopped by: Fransisca Kaufmann Ahliya Glatt, MD   gabapentin 300 MG capsule Commonly known as: NEURONTIN Stopped by: Worthy Rancher, MD   methocarbamol 750 MG tablet Commonly known as: Robaxin-750 Stopped by: Worthy Rancher, MD       TAKE these medications    albuterol 108 (90 Base) MCG/ACT inhaler Commonly known as: VENTOLIN HFA INHALE 2 PUFFS INTO THE LUNGS EVERY 6 HOURS AS NEEDED FOR WHEEZE OR SHORTNESS OF BREATH         Objective:   BP 121/81   Pulse 90   Ht '6\' 1"'$  (1.854 m)   Wt 212 lb (96.2 kg)   SpO2 96%   BMI 27.97 kg/m   Wt Readings from Last 3 Encounters:  06/06/22 212 lb (96.2 kg)  04/05/22 215 lb (97.5 kg)  04/04/22 213 lb 3.2 oz (96.7 kg)    Physical Exam Vitals and nursing note reviewed.  Constitutional:      General: He is not in acute distress.    Appearance:  He is well-developed. He is not diaphoretic.  Eyes:     General: No scleral icterus.    Conjunctiva/sclera: Conjunctivae normal.  Neck:     Thyroid: No thyromegaly.  Cardiovascular:     Rate and Rhythm: Normal rate and regular rhythm.     Heart sounds: Normal heart sounds. No murmur heard. Pulmonary:     Effort: Pulmonary effort is normal. No respiratory distress.     Breath sounds: Normal breath sounds. No wheezing, rhonchi or rales.  Chest:     Chest wall: No tenderness.  Musculoskeletal:        General: Normal range of motion.     Cervical back: Neck supple.  Lymphadenopathy:     Cervical: No cervical adenopathy.  Skin:    General: Skin is warm and dry.     Findings: No rash.  Neurological:     Mental Status: He is alert and oriented to person, place, and time.     Coordination: Coordination normal.  Psychiatric:        Behavior: Behavior normal.       Assessment & Plan:   Problem List Items  Addressed This Visit       Respiratory   Mild intermittent asthma without complication - Primary   Relevant Medications   albuterol (VENTOLIN HFA) 108 (90 Base) MCG/ACT inhaler   Other Visit Diagnoses     Acute bronchitis, unspecified organism       Relevant Medications   albuterol (VENTOLIN HFA) 108 (90 Base) MCG/ACT inhaler     Gave sample for Breztri, will fill his albuterol inhaler.  He does have chronic asthma. Refill albuterol, if he tries the [place] and he likes that then we can do it refill for him Follow up plan: Return if symptoms worsen or fail to improve.  Counseling provided for all of the vaccine components No orders of the defined types were placed in this encounter.   Caryl Pina, MD [facility] 06/06/2022, 5:02 PM

## 2022-11-30 ENCOUNTER — Other Ambulatory Visit: Payer: Self-pay | Admitting: Family Medicine

## 2022-11-30 DIAGNOSIS — J209 Acute bronchitis, unspecified: Secondary | ICD-10-CM

## 2022-11-30 DIAGNOSIS — J4520 Mild intermittent asthma, uncomplicated: Secondary | ICD-10-CM

## 2022-12-17 ENCOUNTER — Ambulatory Visit (INDEPENDENT_AMBULATORY_CARE_PROVIDER_SITE_OTHER): Payer: 59

## 2022-12-17 ENCOUNTER — Encounter: Payer: Self-pay | Admitting: Family

## 2022-12-17 VITALS — BP 120/82 | HR 56 | Temp 97.2°F | Ht 73.0 in | Wt 198.8 lb

## 2022-12-17 DIAGNOSIS — M25562 Pain in left knee: Secondary | ICD-10-CM | POA: Diagnosis not present

## 2022-12-17 DIAGNOSIS — Z8781 Personal history of (healed) traumatic fracture: Secondary | ICD-10-CM | POA: Diagnosis not present

## 2022-12-17 DIAGNOSIS — G8929 Other chronic pain: Secondary | ICD-10-CM | POA: Diagnosis not present

## 2022-12-17 NOTE — Progress Notes (Signed)
Subjective:    Patient ID: James Hull, male    DOB: 07-29-1975, 47 y.o.   MRN: [id number]  Chief Complaint  Patient presents with   Knee Pain    Left knee pain    Pt presents to the office today with left knee pain for the last two years ago. He has hx of left femur fracture and nail fixation. Knee Pain  The incident occurred more than 1 week ago. The quality of the pain is described as aching. The pain is at a severity of 8/10. The pain is moderate. The pain has been Intermittent since onset. Pertinent negatives include no loss of motion or tingling. He reports no foreign bodies present. The symptoms are aggravated by weight bearing and movement. He has tried nothing for the symptoms. The treatment provided mild relief.      Review of Systems  Neurological:  Negative for tingling.  All other systems reviewed and are negative.      Objective:   Physical Exam Vitals reviewed.  Constitutional:      General: He is not in acute distress.    Appearance: He is well-developed.  HENT:     Head: Normocephalic.  Eyes:     General:        Right eye: No discharge.        Left eye: No discharge.     Pupils: Pupils are equal, round, and reactive to light.  Neck:     Thyroid: No thyromegaly.  Cardiovascular:     Rate and Rhythm: Normal rate and regular rhythm.     Heart sounds: Normal heart sounds. No murmur heard. Pulmonary:     Effort: Pulmonary effort is normal. No respiratory distress.     Breath sounds: Normal breath sounds. No wheezing.  Abdominal:     General: Bowel sounds are normal. There is no distension.     Palpations: Abdomen is soft.     Tenderness: There is no abdominal tenderness.  Musculoskeletal:        General: No tenderness. Normal range of motion.     Cervical back: Normal range of motion and neck supple.  Skin:    General: Skin is warm and dry.     Findings: No erythema or rash.     Comments: Full ROM of left knee, crepitus in left lateral knee   Neurological:     Mental Status: He is alert and oriented to person, place, and time.     Cranial Nerves: No cranial nerve deficit.     Deep Tendon Reflexes: Reflexes are normal and symmetric.  Psychiatric:        Behavior: Behavior normal.        Thought Content: Thought content normal.        Judgment: Judgment normal.     BP 120/82   Pulse (!) 56   Temp (!) 97.2 F (36.2 C) (Temporal)   Ht 6\' 1"  (1.854 m)   Wt 198 lb 12.8 oz (90.2 kg)   SpO2 96%   BMI 26.23 kg/m      Assessment & Plan:   James Hull comes in today with chief complaint of Knee Pain (Left knee pain )   Diagnosis and orders addressed:  1. Chronic pain of left knee - Ambulatory referral to Orthopedic Surgery  2. H/O fracture of femur - Ambulatory referral to Orthopedic Surgery    Referral to Ortho Does not want NSAID's or medication today Will hold off on x-ray today  since Ortho will do it  Jannifer Rodney, FNP

## 2022-12-17 NOTE — Patient Instructions (Signed)
 Chronic Knee Pain, Adult Knee pain that lasts longer than 3 months is called chronic knee pain. You may have pain in one or both knees. Symptoms of chronic knee pain may also include swelling and stiffness. Many conditions can cause chronic knee pain. The most common cause is wear and tear of your knee joint as you get older. Other possible causes include: A disease that causes inflammation of the knee, such as rheumatoid arthritis. This usually affects both knees. A condition called inflammatory arthritis, such as gout. An injury to the knee that causes arthritis. An injury to the knee that damages the ligaments. Ligaments are tissues that connect bones to each other. Runner's knee or pain behind the kneecap. Treatment for chronic knee pain depends on the cause. The main treatments for chronic knee pain are: Doing exercises to help your knee move better and get stronger, called physical therapy. Losing weight if you are overweight. This condition may also be treated with medicines, injections, a knee sleeve or brace, and by using crutches. You health care provider may also recommend rest, ice, pressure (compression), and elevation, also called RICE therapy. Follow these instructions at home: If you have a knee sleeve or brace that can be taken off:  Wear the knee sleeve or brace as told by your provider. Take it off only if your provider says that you can. Check the skin around it every day. Tell your provider if you see problems. Loosen the knee sleeve or brace if your toes tingle, are numb, or turn cold and blue. Keep the knee sleeve or brace clean and dry. Bathing If the knee sleeve or brace is not waterproof: Do not let it get wet. Cover it when you take a bath or shower. Use a cover that does not let any water in. Managing pain, stiffness, and swelling     If told, put heat on the area. Do this as often as told. Use the heat source that your provider recommends, such as a moist  heat pack or a heating pad. If you have a knee sleeve or brace that you can take off, remove it as told. Place a towel between your skin and the heat source. Leave the heat on for 20-30 minutes. If told, put ice on the area. If you have a knee sleeve or brace that you can take off, remove it as told. Put ice in a plastic bag. Place a towel between your skin and the bag. Leave the ice on for 20 minutes, 2-3 times a day. If your skin turns bright red, remove the ice or heat right away to prevent skin damage. The risk of damage is higher if you cannot feel pain, heat, or cold. Move your toes often to reduce stiffness and swelling. Raise the injured area above the level of your heart while you are sitting or lying down. Use a pillow to support your foot as needed. Activity Avoid activities where both feet leave the ground at the same time. Avoid running, jumping rope, and doing jumping jacks. Follow the exercise plan that your provider made for you. Your provider may suggest that you: Avoid activities that make knee pain worse. This may mean that you need to change your exercise routines, sports, or job duties. Wear shoes with cushioned soles. Avoid sports that require running and sudden changes in direction. Do physical therapy. Physical therapy helps your knee move better and get stronger. Exercise as told. Do exercises that increase balance and strength, such as  tai chi and yoga. Do not stand or walk on your injured knee until you're told it's okay. Use crutches as told. Return to normal activities when you're told. Ask what things are safe for you to do. General instructions Take your medicines only as told by your provider. If you are overweight, work with your provider and an expert in healthy eating called a dietitian to set goals to lose weight. Losing even a little weight can reduce knee pain. Being overweight can make your knee hurt more. Do not smoke, vape, or use products with  nicotine or tobacco in them. If you need help quitting, talk with your provider. Keep all follow-up visits. Your provider will monitor your pain and try other treatments if needed. Contact a health care provider if: You have knee pain that is not getting better or gets worse. You are not able to do your exercises due to knee pain. Get help right away if: Your knee swells and the swelling becomes worse. You cannot move your knee. You have severe knee pain. This information is not intended to replace advice given to you by your health care provider. Make sure you discuss any questions you have with your health care provider. Document Revised: 05/07/2022 Document Reviewed: 05/07/2022 Elsevier Patient Education  2024 [employer].

## 2023-06-18 ENCOUNTER — Other Ambulatory Visit: Payer: Self-pay | Admitting: Family Medicine

## 2023-06-18 DIAGNOSIS — J209 Acute bronchitis, unspecified: Secondary | ICD-10-CM

## 2023-06-18 DIAGNOSIS — J4520 Mild intermittent asthma, uncomplicated: Secondary | ICD-10-CM

## 2023-06-19 MED ORDER — ALBUTEROL SULFATE HFA 108 (90 BASE) MCG/ACT IN AERS
INHALATION_SPRAY | RESPIRATORY_TRACT | 2 refills | Status: DC
Start: 2023-06-19 — End: 2023-12-13

## 2023-06-19 NOTE — Telephone Encounter (Signed)
 [facility] NTBS last OV 06/06/22 for chronic FU NO RF sent to pharmacy since last OV greater than a year

## 2023-06-19 NOTE — Telephone Encounter (Signed)
 Apt scheduled for 07/15/2023

## 2023-06-19 NOTE — Addendum Note (Signed)
 Addended by: Julious Payer D on: 06/19/2023 08:38 AM   Modules accepted: Orders

## 2023-07-15 ENCOUNTER — Ambulatory Visit: Admitting: Family Medicine

## 2023-07-17 ENCOUNTER — Encounter: Payer: Self-pay | Admitting: Family Medicine

## 2023-10-24 ENCOUNTER — Encounter: Payer: Self-pay | Admitting: Family Medicine

## 2023-10-24 ENCOUNTER — Other Ambulatory Visit: Payer: Self-pay | Admitting: Family Medicine

## 2023-10-24 DIAGNOSIS — J4520 Mild intermittent asthma, uncomplicated: Secondary | ICD-10-CM

## 2023-10-24 DIAGNOSIS — J209 Acute bronchitis, unspecified: Secondary | ICD-10-CM

## 2023-10-24 NOTE — Telephone Encounter (Signed)
 [facility] NTBS lst OV for chronic FU 06/06/22 NO RF sent to pharmacy last OV greater than a year

## 2023-10-24 NOTE — Telephone Encounter (Signed)
 LMTCB to schedule appt Letter mailed

## 2023-12-13 ENCOUNTER — Ambulatory Visit: Admitting: Family

## 2023-12-13 ENCOUNTER — Encounter: Payer: Self-pay | Admitting: Family

## 2023-12-13 VITALS — BP 123/83 | HR 57 | Temp 97.8°F | Ht 73.0 in | Wt 205.8 lb

## 2023-12-13 DIAGNOSIS — S90464A Insect bite (nonvenomous), right lesser toe(s), initial encounter: Secondary | ICD-10-CM

## 2023-12-13 DIAGNOSIS — J4520 Mild intermittent asthma, uncomplicated: Secondary | ICD-10-CM | POA: Diagnosis not present

## 2023-12-13 DIAGNOSIS — W57XXXA Bitten or stung by nonvenomous insect and other nonvenomous arthropods, initial encounter: Secondary | ICD-10-CM | POA: Diagnosis not present

## 2023-12-13 MED ORDER — ALBUTEROL SULFATE HFA 108 (90 BASE) MCG/ACT IN AERS: INHALATION_SPRAY | RESPIRATORY_TRACT | 2 refills | Status: DC

## 2023-12-13 MED ORDER — MUPIROCIN CALCIUM 2 % EX CREA: 1.0000 | TOPICAL_CREAM | Freq: Two times a day (BID) | CUTANEOUS | 0 refills | Status: AC

## 2023-12-13 NOTE — Progress Notes (Signed)
 Subjective:    Patient ID: James Hull, male    DOB: 07-05-1975, 48 y.o.   MRN: [id number]  Chief Complaint  Patient presents with   Insect Bite    SPIDER BITE ON TOP OF RIGHT PINKY TOE   PT presents to the office today with right fifth toe insect bite that he noticed 8 days ago. He states it was erythemas and getting worse. He states he popped it yesterday with sanguinous discharge and neosporin. Reports his toe is now better.   Asthma He complains of shortness of breath and wheezing. There is no cough. The current episode started more than 1 year ago. The problem occurs intermittently. His symptoms are aggravated by pollen. His symptoms are alleviated by rest. He reports minimal improvement on treatment. His past medical history is significant for asthma.      Review of Systems  Respiratory:  Positive for shortness of breath and wheezing. Negative for cough.   All other systems reviewed and are negative.   Social History   Socioeconomic History   Marital status: Legally Separated    Spouse name: Not on file   Number of children: Not on file   Years of education: Not on file   Highest education level: Not on file  Occupational History   Not on file  Tobacco Use   Smoking status: Former    Current packs/day: 0.00    Average packs/day: 0.3 packs/day for 2.0 years (0.5 ttl pk-yrs)    Types: Cigarettes    Start date: 02/09/1994    Quit date: 02/10/1996    Years since quitting: 27.8   Smokeless tobacco: Former  Building services engineer status: Never Used  Substance and Sexual Activity   Alcohol use: Yes    Alcohol/week: 0.0 - 2.0 standard drinks of alcohol    Comment: rare   Drug use: Yes    Frequency: 20.0 times per week    Types: Marijuana    Comment: last used 04/04/22   Sexual activity: Not on file  Other Topics Concern   Not on file  Social History Narrative   Not on file   Social Drivers of Health   Financial Resource Strain: Not on file  Food  Insecurity: Not on file  Transportation Needs: Not on file  Physical Activity: Not on file  Stress: Not on file  Social Connections: Not on file   Family History  Problem Relation Age of Onset   Colon polyps Brother    Diabetes Paternal Grandfather    Colon cancer Neg Hx    Esophageal cancer Neg Hx    Rectal cancer Neg Hx    Stomach cancer Neg Hx         Objective:   Physical Exam Vitals reviewed.  Constitutional:      General: He is not in acute distress.    Appearance: He is well-developed.  HENT:     Head: Normocephalic.  Eyes:     General:        Right eye: No discharge.        Left eye: No discharge.     Pupils: Pupils are equal, round, and reactive to light.  Neck:     Thyroid: No thyromegaly.  Cardiovascular:     Rate and Rhythm: Normal rate and regular rhythm.     Heart sounds: Normal heart sounds. No murmur heard. Pulmonary:     Effort: Pulmonary effort is normal. No respiratory distress.     Breath  sounds: Normal breath sounds. No wheezing.  Abdominal:     General: Bowel sounds are normal. There is no distension.     Palpations: Abdomen is soft.     Tenderness: There is no abdominal tenderness.  Musculoskeletal:        General: No tenderness. Normal range of motion.     Cervical back: Normal range of motion and neck supple.  Skin:    General: Skin is warm and dry.     Findings: Erythema present. No rash.         Comments: Slight erythemas 1X1 cm, no discharge  Neurological:     Mental Status: He is alert and oriented to person, place, and time.     Cranial Nerves: No cranial nerve deficit.     Deep Tendon Reflexes: Reflexes are normal and symmetric.  Psychiatric:        Behavior: Behavior normal.        Thought Content: Thought content normal.        Judgment: Judgment normal.       BP 123/83   Pulse (!) 57   Temp 97.8 F (36.6 C)   Ht 6' 1 (1.854 m)   Wt 205 lb 12.8 oz (93.4 kg)   SpO2 99%   BMI 27.15 kg/m      Assessment & Plan:   KATO WIECZOREK comes in today with chief complaint of Insect Bite (SPIDER BITE ON TOP OF RIGHT PINKY TOE)   Diagnosis and orders addressed:  1. Insect bite of lesser toe of right foot, initial encounter (Primary) Applied bactroban  cream  Keep clean and dry  Report any increased redness, fevers, or pain - mupirocin  cream (BACTROBAN ) 2 %; Apply 1 Application topically 2 (two) times daily.  Dispense: 30 g; Refill: 0  2. Mild intermittent asthma without complication Albuterol  as needed Wants refill to have in car and house  Avoid allergens  - albuterol  (VENTOLIN  HFA) 108 (90 Base) MCG/ACT inhaler; INHALE 2 PUFFS INTO THE LUNGS EVERY 6 HOURS AS NEEDED FOR WHEEZE OR SHORTNESS OF BREATH  Dispense: 8 g; Refill: 2    Bari Learn, FNP

## 2023-12-13 NOTE — Patient Instructions (Signed)
 Insect Bite, Adult  An insect bite can make your skin red, itchy, and swollen. An insect bite is different from an insect sting, which happens when an insect injects poison (venom) into the skin.  Some insects can spread disease to people through a bite. However, most insect bites do not lead to disease and are not serious.  What are the causes?  Insects may bite for a variety of reasons, including:  Hunger.  To defend themselves.  Insects that bite include:  Spiders.  Mosquitoes and flies.  Ticks and fleas.  Ants.  Kissing bugs.  Chiggers.  What are the signs or symptoms?  In many cases, symptoms last for 2-4 days. However, itching can last up to 10 days. Symptoms include:  Itching or pain in the bite area.  Redness and swelling in the bite area.  An open wound (skin ulcer).  In rare cases, a person may have a severe allergic reaction (anaphylactic reaction) to a bite. Symptoms of an anaphylactic reaction may include:  Feeling warm in the face (flushed). This may include redness.  Itchy, red, swollen areas of skin (hives).  Swelling of the eyes, lips, face, mouth, tongue, or throat.  Wheezing or difficulty breathing, speaking, or swallowing.  Dizziness, light-headedness, or fainting.  Abdominal symptoms like cramping, nausea, vomiting, or diarrhea.  How is this diagnosed?  This condition is usually diagnosed based on symptoms and a physical exam. During the exam, your health care provider will look at the bite and ask you what kind of insect bit you.  How is this treated?  Most insect bites are not serious. Symptoms often go away on their own and treatment is not usually needed. When treatment is recommended, it may include:  Applying ice to the affected area.  Applying steroid or other anti-itch creams, like calamine lotion, to the bite area.  Medicines called antihistamines to reduce itching.  You may also need:  A tetanus shot if you are not up to date.  Antibiotic cream or an oral antibiotic if the bite becomes  infected (this is uncommon).  Follow these instructions at home:  Bite area care    Do not scratch the bite area. It may help to cover the bite area with a bandage or close-fitting clothing.  Keep the bite area clean and dry. Wash it every day with soap and water as told by your health care provider.  Check the bite area every day for signs of infection. Check for:  More redness, swelling, or pain.  Fluid or blood.  Warmth.  Pus or a bad smell.  Managing pain, itching, and swelling    You may apply cortisone cream, calamine lotion, or a paste made of baking soda and water to the bite area as told by your health care provider.  If directed, put ice on the bite area. To do this:  Put ice in a plastic bag.  Place a towel between your skin and the bag.  Leave the ice on for 20 minutes, 2-3 times a day.  If your skin turns bright red, remove the ice right away to prevent skin damage. The risk of skin damage is higher if you cannot feel pain, heat, or cold.  General instructions  Apply or take over-the-counter and prescription medicine only as told by your health care provider.  If you were prescribed antibiotics, take or apply them as told by your health care provider. Do not stop using the antibiotic even if you start to feel  better.  How is this prevented?  To help reduce your risk of insect bites:  When you are outdoors, wear clothing that covers your arms and legs. This is especially important in the early morning and evening.  Use insect repellent. The best insect repellents contain DEET, picaridin, oil of lemon eucalyptus (OLE), or IR3535.  Consider spraying your clothing with a pesticide called permethrin. Permethrin helps prevent insect bites. It works for several weeks and for up to 5-6 clothing washes. Do not apply permethrin directly to the skin.  If your home windows do not have screens, consider installing them.  If you will be sleeping in an area where there are mosquitoes, consider covering your sleeping  area with a mosquito net.  Contact a health care provider if:  Your bite area has signs of infection, such as:  More redness, swelling, or pain.  Fluid or blood.  Warmth.  Pus or a bad smell.  You have a fever.  Get help right away if:  You have a rash.  You have muscle or joint pain.  You feel unusually tired or weak.  You have neck pain or a headache.  You develop symptoms of an anaphylactic reaction. These may include:  Swelling of the eyes, lips, face, mouth, tongue, or throat.  Flushed skin or hives.  Wheezing.  Difficulty breathing, speaking, or swallowing.  Dizziness, light-headedness, or fainting.  Abdominal pain, cramping, vomiting, or diarrhea.  These symptoms may be an emergency. Get help right away. Call 911.  Do not wait to see if the symptoms will go away.  Do not drive yourself to the hospital.  Summary  An insect bite can make your skin red, itchy, and swollen.  Treatment is usually not needed. Symptoms often go away on their own. When treatment is recommended, it may involve taking medicine, applying medicine to the area, or applying ice.  Apply or take over-the-counter and prescription medicines only as told by your health care provider.  Use insect repellent to help prevent insect bites.  Contact a health care provider if your bite area has signs of infection.  This information is not intended to replace advice given to you by your health care provider. Make sure you discuss any questions you have with your health care provider.  Document Revised: 06/21/2021 Document Reviewed: 06/06/2021  Elsevier Patient Education  2024 [employer].

## 2024-03-25 ENCOUNTER — Other Ambulatory Visit: Payer: Self-pay | Admitting: Family

## 2024-03-25 DIAGNOSIS — J4520 Mild intermittent asthma, uncomplicated: Secondary | ICD-10-CM
